# Patient Record
Sex: Male | Born: 1965 | Race: Black or African American | Hispanic: No | Marital: Single | Smoking: Never smoker
Health system: Southern US, Community
[De-identification: ages and names within clinical notes are randomized; demographics above are authoritative.]

## PROBLEM LIST (undated history)

## (undated) DIAGNOSIS — Z951 Presence of aortocoronary bypass graft: Secondary | ICD-10-CM

## (undated) DIAGNOSIS — E785 Hyperlipidemia, unspecified: Secondary | ICD-10-CM

## (undated) DIAGNOSIS — E669 Obesity, unspecified: Secondary | ICD-10-CM

## (undated) DIAGNOSIS — I10 Essential (primary) hypertension: Secondary | ICD-10-CM

## (undated) DIAGNOSIS — I2510 Atherosclerotic heart disease of native coronary artery without angina pectoris: Secondary | ICD-10-CM

## (undated) DIAGNOSIS — E119 Type 2 diabetes mellitus without complications: Secondary | ICD-10-CM

## (undated) HISTORY — DX: Obesity, unspecified: E66.9

## (undated) HISTORY — DX: Type 2 diabetes mellitus without complications: E11.9

## (undated) HISTORY — DX: Presence of aortocoronary bypass graft: Z95.1

## (undated) HISTORY — DX: Essential (primary) hypertension: I10

## (undated) HISTORY — DX: Hyperlipidemia, unspecified: E78.5

## (undated) HISTORY — PX: CORONARY ARTERY BYPASS GRAFT: SHX141

---

## 2002-07-16 ENCOUNTER — Emergency Department (HOSPITAL_COMMUNITY): Admission: EM | Admit: 2002-07-16 | Discharge: 2002-07-16 | Payer: Self-pay | Admitting: Emergency Medicine

## 2005-02-24 ENCOUNTER — Ambulatory Visit: Payer: Self-pay | Admitting: Internal Medicine

## 2014-05-28 ENCOUNTER — Ambulatory Visit (INDEPENDENT_AMBULATORY_CARE_PROVIDER_SITE_OTHER): Payer: Medicare Other | Admitting: Cardiovascular Disease

## 2014-05-28 ENCOUNTER — Encounter: Payer: Self-pay | Admitting: Cardiovascular Disease

## 2014-05-28 VITALS — BP 144/96 | HR 57 | Ht 69.0 in | Wt 260.5 lb

## 2014-05-28 DIAGNOSIS — I25700 Atherosclerosis of coronary artery bypass graft(s), unspecified, with unstable angina pectoris: Secondary | ICD-10-CM

## 2014-05-28 DIAGNOSIS — I10 Essential (primary) hypertension: Secondary | ICD-10-CM

## 2014-05-28 DIAGNOSIS — E785 Hyperlipidemia, unspecified: Secondary | ICD-10-CM | POA: Insufficient documentation

## 2014-05-28 DIAGNOSIS — I25810 Atherosclerosis of coronary artery bypass graft(s) without angina pectoris: Secondary | ICD-10-CM | POA: Insufficient documentation

## 2014-05-28 DIAGNOSIS — E119 Type 2 diabetes mellitus without complications: Secondary | ICD-10-CM | POA: Insufficient documentation

## 2014-05-28 NOTE — Assessment & Plan Note (Signed)
History of hypertension with blood pressure measured today 144/96 which she says is somewhat higher than usual. He is on amlodipine 5, metoprolol 50 mg daily. Continue current medications at current doses

## 2014-05-28 NOTE — Assessment & Plan Note (Signed)
On atorvastatin 40 mg daily. This is followed by his primary care physician. We will get his most recent lab work.

## 2014-05-28 NOTE — Progress Notes (Signed)
05/28/2014 James Rice   1965-10-29  [id number]  Primary Physician Paulino RilyJONES,ENRICO G, MD Primary Cardiologist: Runell GessJonathan J. Gunther Zawadzki MD Roseanne RenoFACP,FACC,FAHA, FSCAI   HPI:  Mr. James Rice is a very pleasant 48 year old severely overweight divorced African-American male with no children is currently retired and worked at a [employer] in the past. He was referred by Dr. Knox RoyaltyEnrico  Rice at [facility] to be established in her cardiovascular practice because of prior history of CAD. He has a history of treated hypertension, diabetes or hyperlipidemia. His does not smoke. He had coronary artery bypass [facility] [place]. He has been asymptomatic since.   Current Outpatient Prescriptions  Medication Sig Dispense Refill  . amLODipine (NORVASC) 5 MG tablet Take 1 tablet by mouth daily.  4  . atorvastatin (LIPITOR) 40 MG tablet Take 40 mg by mouth daily.    . metFORMIN (GLUCOPHAGE) 500 MG tablet Take 1 tablet by mouth 2 (two) times daily.    . methimazole (TAPAZOLE) 10 MG tablet Take 1 tablet by mouth 2 (two) times daily.    . metoprolol (LOPRESSOR) 50 MG tablet Take 1 tablet by mouth 2 (two) times daily.    . Multiple Vitamin (MULTIVITAMIN) tablet Take 1 tablet by mouth daily.    Marland Kitchen. omeprazole (PRILOSEC OTC) 20 MG tablet Take 20 mg by mouth daily.     No current facility-administered medications for this visit.    Allergies  Allergen Reactions  . Fish Allergy     History   Social History  . Marital Status: Single    Spouse Name: N/A    Number of Children: N/A  . Years of Education: N/A   Occupational History  . Not on file.   Social History Main Topics  . Smoking status: Never Smoker   . Smokeless tobacco: Never Used  . Alcohol Use: 1.2 - 1.8 oz/week    2-3 Shots of liquor per week  . Drug Use: No  . Sexual Activity: Not on file   Other Topics Concern  . Not on file   Social History Narrative  . No narrative on file     Review of  Systems: General: negative for chills, fever, night sweats or weight changes.  Cardiovascular: negative for chest pain, dyspnea on exertion, edema, orthopnea, palpitations, paroxysmal nocturnal dyspnea or shortness of breath Dermatological: negative for rash Respiratory: negative for cough or wheezing Urologic: negative for hematuria Abdominal: negative for nausea, vomiting, diarrhea, bright red blood per rectum, melena, or hematemesis Neurologic: negative for visual changes, syncope, or dizziness All other systems reviewed and are otherwise negative except as noted above.    Blood pressure 144/96, pulse 57, height 5\' 9"  (1.753 m), weight 260 lb 8 oz (118.162 kg).  General appearance: alert and no distress Neck: no adenopathy, no carotid bruit, no JVD, supple, symmetrical, trachea midline and thyroid not enlarged, symmetric, no tenderness/mass/nodules Lungs: clear to auscultation bilaterally Heart: regular rate and rhythm, S1, S2 normal, no murmur, click, rub or gallop Extremities: extremities normal, atraumatic, no cyanosis or edema  EKG sinus bradycardia 57 without ST or T-wave changes. I personally reviewed this EKG  ASSESSMENT AND PLAN:   CAD (coronary artery disease) of artery bypass graft History of CAD status [facility] [facility]. He has been asymptomatic since.  Hyperlipidemia On atorvastatin 40 mg daily. This is followed by his primary care physician. We will get his most recent lab  work.  Essential hypertension History of hypertension with blood pressure measured today 144/96 which she says is somewhat higher than usual. He is on amlodipine 5, metoprolol 50 mg daily. Continue current medications at current doses      Runell GessJonathan J. Zyriah Mask MD [facility] HealthFACP,FACC,FAHA, [facility] Center[facility]SCAI 05/28/2014 2:53 PM

## 2014-05-28 NOTE — Assessment & Plan Note (Addendum)
History of CAD status post coronary [facility] [place] 06/[facility]. He has been asymptomatic since.

## 2014-05-28 NOTE — Patient Instructions (Signed)
Your physician wants you to follow-up in: 1 year with Dr Berry. You will receive a reminder letter in the mail two months in advance. If you don't receive a letter, please call our office to schedule the follow-up appointment.  

## 2014-06-15 ENCOUNTER — Encounter (HOSPITAL_COMMUNITY): Payer: Self-pay | Admitting: Emergency Medicine

## 2014-06-15 ENCOUNTER — Emergency Department (HOSPITAL_COMMUNITY)
Admission: EM | Admit: 2014-06-15 | Discharge: 2014-06-16 | Disposition: A | Discharge status: Home / Self Care | Payer: Medicare Other | Attending: Emergency Medicine | Admitting: Emergency Medicine

## 2014-06-15 DIAGNOSIS — E119 Type 2 diabetes mellitus without complications: Secondary | ICD-10-CM | POA: Diagnosis not present

## 2014-06-15 DIAGNOSIS — I2510 Atherosclerotic heart disease of native coronary artery without angina pectoris: Secondary | ICD-10-CM | POA: Insufficient documentation

## 2014-06-15 DIAGNOSIS — E669 Obesity, unspecified: Secondary | ICD-10-CM | POA: Diagnosis not present

## 2014-06-15 DIAGNOSIS — E785 Hyperlipidemia, unspecified: Secondary | ICD-10-CM | POA: Insufficient documentation

## 2014-06-15 DIAGNOSIS — I10 Essential (primary) hypertension: Secondary | ICD-10-CM | POA: Diagnosis not present

## 2014-06-15 DIAGNOSIS — Z79899 Other long term (current) drug therapy: Secondary | ICD-10-CM | POA: Diagnosis not present

## 2014-06-15 DIAGNOSIS — Z7982 Long term (current) use of aspirin: Secondary | ICD-10-CM | POA: Diagnosis not present

## 2014-06-15 DIAGNOSIS — R1031 Right lower quadrant pain: Secondary | ICD-10-CM | POA: Diagnosis present

## 2014-06-15 DIAGNOSIS — Z955 Presence of coronary angioplasty implant and graft: Secondary | ICD-10-CM | POA: Diagnosis not present

## 2014-06-15 HISTORY — DX: Atherosclerotic heart disease of native coronary artery without angina pectoris: I25.10

## 2014-06-15 LAB — CBC WITH DIFFERENTIAL/PLATELET
Basophils Absolute: 0 10*3/uL (ref 0.0–0.1)
Basophils Relative: 0 % (ref 0–1)
Eosinophils Absolute: 0.1 10*3/uL (ref 0.0–0.7)
Eosinophils Relative: 1 % (ref 0–5)
HEMATOCRIT: 42.1 % (ref 39.0–52.0)
HEMOGLOBIN: 14.6 g/dL (ref 13.0–17.0)
LYMPHS ABS: 2.7 10*3/uL (ref 0.7–4.0)
LYMPHS PCT: 30 % (ref 12–46)
MCH: 27.2 pg (ref 26.0–34.0)
MCHC: 34.7 g/dL (ref 30.0–36.0)
MCV: 78.4 fL (ref 78.0–100.0)
MONO ABS: 0.7 10*3/uL (ref 0.1–1.0)
Monocytes Relative: 7 % (ref 3–12)
Neutro Abs: 5.6 10*3/uL (ref 1.7–7.7)
Neutrophils Relative %: 62 % (ref 43–77)
Platelets: 235 10*3/uL (ref 150–400)
RBC: 5.37 MIL/uL (ref 4.22–5.81)
RDW: 14.9 % (ref 11.5–15.5)
WBC: 9.1 10*3/uL (ref 4.0–10.5)

## 2014-06-15 LAB — URINALYSIS, ROUTINE W REFLEX MICROSCOPIC
Bilirubin Urine: NEGATIVE
Glucose, UA: NEGATIVE mg/dL
Hgb urine dipstick: NEGATIVE
Ketones, ur: NEGATIVE mg/dL
LEUKOCYTES UA: NEGATIVE
Nitrite: NEGATIVE
PROTEIN: NEGATIVE mg/dL
Specific Gravity, Urine: 1.016 (ref 1.005–1.030)
Urobilinogen, UA: 1 mg/dL (ref 0.0–1.0)
pH: 5.5 (ref 5.0–8.0)

## 2014-06-15 LAB — LIPASE, BLOOD: Lipase: 28 U/L (ref 11–59)

## 2014-06-15 LAB — COMPREHENSIVE METABOLIC PANEL
ALT: 10 U/L (ref 0–53)
AST: 18 U/L (ref 0–37)
Albumin: 3.9 g/dL (ref 3.5–5.2)
Alkaline Phosphatase: 120 U/L — ABNORMAL HIGH (ref 39–117)
Anion gap: 12 (ref 5–15)
BUN: 10 mg/dL (ref 6–23)
CALCIUM: 9.5 mg/dL (ref 8.4–10.5)
CO2: 29 mEq/L (ref 19–32)
CREATININE: 0.85 mg/dL (ref 0.50–1.35)
Chloride: 94 mEq/L — ABNORMAL LOW (ref 96–112)
GFR calc Af Amer: >90 mL/min (ref >90)
GFR calc non Af Amer: >90 mL/min (ref >90)
GLUCOSE: 109 mg/dL — AB (ref 70–99)
Potassium: 3.3 mEq/L — ABNORMAL LOW (ref 3.7–5.3)
SODIUM: 135 meq/L — AB (ref 137–147)
Total Bilirubin: 0.6 mg/dL (ref 0.3–1.2)
Total Protein: 8.3 g/dL (ref 6.0–8.3)

## 2014-06-15 NOTE — ED Provider Notes (Signed)
CSN: [id number]     Arrival date & time 06/15/14  2128 History   First MD Initiated Contact with Patient 06/15/14 2212     Chief Complaint  Patient presents with  . Abdominal Pain     (Consider location/radiation/quality/duration/timing/severity/associated sxs/prior Treatment) HPI   James Rice is a 48 y.o. male presents for evaluation of intermittent right lower quadrant pain, for 2 days.  He had a nuclear medicine stress test, 4 days ago, as follow-up for his coronary bypass, 2 years ago.  He denies fever, chills, nausea, vomiting, diarrhea, constipation, dysuria, urinary frequency, testicular pain or penile discharge.  He's never had this before.  He is taking his usual medications.  There are no other known modifying factors.   Past Medical History  Diagnosis Date  . Status post coronary artery bypass grafting     [place]  . Hypertension   . Diabetes   . Hyperlipidemia   . Obesity   . Coronary artery disease    Past Surgical History  Procedure Laterality Date  . Coronary artery bypass graft     No family history on file. History  Substance Use Topics  . Smoking status: Never Smoker   . Smokeless tobacco: Never Used  . Alcohol Use: 1.2 - 1.8 oz/week    2-3 Shots of liquor per week    Review of Systems  All other systems reviewed and are negative.     Allergies  Fish allergy  Home Medications   Prior to Admission medications   Medication Sig Start Date End Date Taking? Authorizing Provider  amLODipine (NORVASC) 5 MG tablet Take 5 mg by mouth daily.  04/16/14  Yes Historical Provider, MD  aspirin 81 MG tablet Take 81 mg by mouth daily.   Yes Historical Provider, MD  atorvastatin (LIPITOR) 40 MG tablet Take 20 mg by mouth at bedtime.    Yes Historical Provider, MD  losartan-hydrochlorothiazide (HYZAAR) 50-12.5 MG per tablet Take 1 tablet by mouth daily. 06/10/14  Yes Historical Provider, MD  metFORMIN (GLUCOPHAGE) 500 MG tablet Take 500 mg by  mouth 2 (two) times daily.  04/15/14  Yes Historical Provider, MD  metoprolol (LOPRESSOR) 50 MG tablet Take 50 mg by mouth 2 (two) times daily.  04/15/14  Yes Historical Provider, MD  Multiple Vitamin (MULTIVITAMIN) tablet Take 1 tablet by mouth daily.   Yes Historical Provider, MD  omeprazole (PRILOSEC OTC) 20 MG tablet Take 20 mg by mouth daily.   Yes Historical Provider, MD  potassium chloride (K-DUR) 10 MEQ tablet Take 10 mEq by mouth every other day. 06/10/14  Yes Historical Provider, MD   BP 142/72 mmHg  Pulse 63  Temp(Src) 97.9 F (36.6 C) (Oral)  Resp 14  Ht 5\' 9"  (1.753 m)  Wt 257 lb (116.574 kg)  BMI 37.93 kg/m2  SpO2 99% Physical Exam  Constitutional: He is oriented to person, place, and time. He appears well-developed and well-nourished.  HENT:  Head: Normocephalic and atraumatic.  Right Ear: External ear normal.  Left Ear: External ear normal.  Eyes: Conjunctivae and EOM are normal. Pupils are equal, round, and reactive to light.  Neck: Normal range of motion and phonation normal. Neck supple.  Cardiovascular: Normal rate, regular rhythm and normal heart sounds.   Pulmonary/Chest: Effort normal and breath sounds normal. He exhibits no bony tenderness.  Abdominal: Soft. He exhibits no mass. There is tenderness (right lower quadrant, mild). There is no rebound and no guarding.  Musculoskeletal: Normal range of motion.  Neurological: He is alert and oriented to person, place, and time. No cranial nerve deficit or sensory deficit. He exhibits normal muscle tone. Coordination normal.  Skin: Skin is warm, dry and intact.  Psychiatric: He has a normal mood and affect. His behavior is normal. Judgment and thought content normal.  Nursing note and vitals reviewed.   ED Course  Procedures (including critical care time) Medications - No data to display  Patient Vitals for the past 24 hrs:  BP Temp Temp src Pulse Resp SpO2 Height Weight  06/15/14 2135 142/72 mmHg 97.9 F (36.6  C) Oral 63 14 99 % 5\' 9"  (1.753 m) 257 lb (116.574 kg)    23:00- CT scan ordered to evaluate for appendicitis.    Labs Review Labs Reviewed  COMPREHENSIVE METABOLIC PANEL - Abnormal; Notable for the following:    Sodium 135 (*)    Potassium 3.3 (*)    Chloride 94 (*)    Glucose, Bld 109 (*)    Alkaline Phosphatase 120 (*)    All other components within normal limits  CBC WITH DIFFERENTIAL  LIPASE, BLOOD  URINALYSIS, ROUTINE W REFLEX MICROSCOPIC    Imaging Review No results found.   EKG Interpretation None      MDM   Final diagnoses:  Right lower quadrant pain    Nonspecific abdominal pain, non-toxic clinically. CT ordered.  Nursing Notes Reviewed/ Care Coordinated Applicable Imaging Reviewed Interpretation of Laboratory Data incorporated into ED treatment  Care to Dr. Judd Lienelo, to disposition after CT abdomen    Flint MelterElliott L Saja Bartolini, MD 06/16/14 1442

## 2014-06-15 NOTE — ED Notes (Signed)
Pt. reports intermittent RLQ pain onset Wednesday last week , denies nausea, vomitting or diarrhea . No fever or chills. Denies pain at triage .

## 2014-06-15 NOTE — ED Notes (Signed)
Pt states that [facility] and was told that the nuclear medicine may upset his stomach. States that the pain started in the middle of the abd and moved to the right.

## 2014-06-16 ENCOUNTER — Emergency Department (HOSPITAL_COMMUNITY): Payer: Medicare Other

## 2014-06-16 MED ORDER — IOHEXOL 300 MG/ML  SOLN
80.0000 mL | Freq: Once | INTRAMUSCULAR | Status: AC | PRN
  Administered 2014-06-16: 80 mL via INTRAVENOUS

## 2014-06-16 MED ORDER — HYDROCODONE-ACETAMINOPHEN 5-325 MG PO TABS: 1.0000 | ORAL_TABLET | Freq: Four times a day (QID) | ORAL | Status: DC | PRN

## 2014-06-16 NOTE — ED Notes (Signed)
Pt finished contrast ct made aware.

## 2014-06-16 NOTE — Discharge Instructions (Signed)
Hydrocodone as prescribed as needed for pain.  Return to the emergency department if he develops severe pain, bloody stool, high fever, or other new and concerning symptoms.   Abdominal Pain Many things can cause abdominal pain. Usually, abdominal pain is not caused by a disease and will improve without treatment. It can often be observed and treated at home. Your health care provider will do a physical exam and possibly order blood tests and X-rays to help determine the seriousness of your pain. However, in many cases, more time must pass before a clear cause of the pain can be found. Before that point, your health care provider may not know if you need more testing or further treatment. HOME CARE INSTRUCTIONS  Monitor your abdominal pain for any changes. The following actions may help to alleviate any discomfort you are experiencing:  Only take over-the-counter or prescription medicines as directed by your health care provider.  Do not take laxatives unless directed to do so by your health care provider.  Try a clear liquid diet (broth, tea, or water) as directed by your health care provider. Slowly move to a bland diet as tolerated. SEEK MEDICAL CARE IF:  You have unexplained abdominal pain.  You have abdominal pain associated with nausea or diarrhea.  You have pain when you urinate or have a bowel movement.  You experience abdominal pain that wakes you in the night.  You have abdominal pain that is worsened or improved by eating food.  You have abdominal pain that is worsened with eating fatty foods.  You have a fever. SEEK IMMEDIATE MEDICAL CARE IF:   Your pain does not go away within 2 hours.  You keep throwing up (vomiting).  Your pain is felt only in portions of the abdomen, such as the right side or the left lower portion of the abdomen.  You pass bloody or black tarry stools. MAKE SURE YOU:  Understand these instructions.   Will watch your condition.   Will  get help right away if you are not doing well or get worse.  Document Released: 03/30/2005 Document Revised: 06/25/2013 Document Reviewed: 02/27/2013 [facility] Patient Information 2015 [employer] [place]. This information is not intended to replace advice given to you by your health care provider. Make sure you discuss any questions you have with your health care provider.

## 2014-06-16 NOTE — ED Provider Notes (Signed)
Care assumed from Dr. Effie ShyWentz at shift change. Patient presents with right lower quadrant pain for the past 2 days. A CT scan was ordered to rule out appendicitis. This returned and was negative. He appears more comfortable and I believe appropriate for discharge. He has no fever, no white count, and a CT scan that is otherwise unremarkable. He will be discharged with pain medication and when necessary return.  James Lyonsouglas Derril Franek, MD 06/16/14 [phone]

## 2015-01-14 ENCOUNTER — Telehealth: Payer: Self-pay | Admitting: Family Medicine

## 2015-01-14 NOTE — Telephone Encounter (Signed)
Pt states he saw dr Kirtland Bouchardk many years ago and wants to know if he will acceptnhim back as a pt.

## 2015-01-15 NOTE — Telephone Encounter (Signed)
Kathy, no Dr.K is not accepting new patients.

## 2015-02-25 NOTE — Telephone Encounter (Signed)
Pt lm for pt.

## 2015-06-30 ENCOUNTER — Telehealth: Payer: Self-pay | Admitting: Cardiovascular Disease

## 2015-06-30 NOTE — Telephone Encounter (Signed)
2 weeks ago went to game dressed warm Lifting weights last week [place] [facility] [place], labs, everything checked out ok.  Placed on naproxen. Concerned that they didn't take a chest x ray.  Is coughing loose non productive and thinks he could benefit from antibiotic. Do you want him to have a chest x ray as he request? Do you want him to be on an antibiotic as he request?

## 2015-06-30 NOTE — Telephone Encounter (Signed)
James Rice is calling because he is having some chest discomfort , does not feel bad or anything just the chest discomfort .Marland Kitchen.  Please call   Thanks

## 2015-06-30 NOTE — Telephone Encounter (Signed)
Needs to be addressed with his primary care physician

## 2015-06-30 NOTE — Telephone Encounter (Signed)
Sent message to Dr. Yetta BarreJones and advised patient to cal Dr. Yetta BarreJones for patients request to do an xray , chest and antibiotic

## 2016-03-22 ENCOUNTER — Encounter: Payer: Self-pay | Admitting: Cardiovascular Disease

## 2016-03-22 ENCOUNTER — Ambulatory Visit (INDEPENDENT_AMBULATORY_CARE_PROVIDER_SITE_OTHER): Payer: Medicare Other | Admitting: Cardiovascular Disease

## 2016-03-22 VITALS — BP 132/78 | HR 58 | Ht 69.0 in | Wt 271.4 lb

## 2016-03-22 DIAGNOSIS — E785 Hyperlipidemia, unspecified: Secondary | ICD-10-CM

## 2016-03-22 DIAGNOSIS — I10 Essential (primary) hypertension: Secondary | ICD-10-CM

## 2016-03-22 NOTE — Progress Notes (Signed)
03/22/2016 Angelene GiovanniMichael A Clauss   1966/05/18  [id number]  Primary Physician Paulino RilyJONES,ENRICO G, MD Primary Cardiologist: Runell GessJonathan J Eureka Valdes MD Roseanne RenoFACP, FACC, FAHA, FSCAI  HPI:  Mr. James CellaBoyd is a very pleasant 50 year old severely overweight divorced African-American male with no children is currently retired and worked at a [employer] in the past. He was referred by Dr. Knox RoyaltyEnrico  Jones at [facility] to be established in her cardiovascular practice because of prior history of CAD. I last saw him in the office 05/28/14. He has a history of treated hypertension, diabetes or hyperlipidemia. His does not smoke. He had coronary artery [facility] . He has been asymptomatic since.   Current Outpatient Prescriptions  Medication Sig Dispense Refill  . amLODipine (NORVASC) 5 MG tablet Take 5 mg by mouth daily.   4  . aspirin 81 MG tablet Take 81 mg by mouth daily.    Marland Kitchen. atorvastatin (LIPITOR) 40 MG tablet Take 20 mg by mouth at bedtime.     Marland Kitchen. HYDROcodone-acetaminophen (NORCO) 5-325 MG per tablet Take 1-2 tablets by mouth every 6 (six) hours as needed. 10 tablet 0  . losartan-hydrochlorothiazide (HYZAAR) 50-12.5 MG per tablet Take 1 tablet by mouth daily.  3  . metFORMIN (GLUCOPHAGE) 500 MG tablet Take 500 mg by mouth 2 (two) times daily.     . metoprolol (LOPRESSOR) 50 MG tablet Take 50 mg by mouth 2 (two) times daily.     . Multiple Vitamin (MULTIVITAMIN) tablet Take 1 tablet by mouth daily.    Marland Kitchen. omeprazole (PRILOSEC OTC) 20 MG tablet Take 20 mg by mouth daily.    . potassium chloride (K-DUR) 10 MEQ tablet Take 10 mEq by mouth every other day.  3   No current facility-administered medications for this visit.     Allergies  Allergen Reactions  . Fish Allergy Nausea And Vomiting    Social History   Social History  . Marital status: Single    Spouse name: N/A  . Number of children: N/A  . Years of education: N/A   Occupational History  . Not on  file.   Social History Main Topics  . Smoking status: Never Smoker  . Smokeless tobacco: Never Used  . Alcohol use 1.2 - 1.8 oz/week    2 - 3 Shots of liquor per week  . Drug use: No  . Sexual activity: Not on file   Other Topics Concern  . Not on file   Social History Narrative  . No narrative on file     Review of Systems: General: negative for chills, fever, night sweats or weight changes.  Cardiovascular: negative for chest pain, dyspnea on exertion, edema, orthopnea, palpitations, paroxysmal nocturnal dyspnea or shortness of breath Dermatological: negative for rash Respiratory: negative for cough or wheezing Urologic: negative for hematuria Abdominal: negative for nausea, vomiting, diarrhea, bright red blood per rectum, melena, or hematemesis Neurologic: negative for visual changes, syncope, or dizziness All other systems reviewed and are otherwise negative except as noted above.    Blood pressure 132/78, pulse (!) 58, height 5\' 9"  (1.753 m), weight 271 lb 6.4 oz (123.1 kg).  General appearance: alert and no distress Neck: no adenopathy, no carotid bruit, no JVD, supple, symmetrical, trachea midline and thyroid not enlarged, symmetric, no tenderness/mass/nodules Lungs: clear to auscultation bilaterally Heart: regular rate and rhythm, S1, S2 normal, no murmur, click, rub or gallop Extremities: extremities normal, atraumatic, no cyanosis or edema  EKG sinus bradycardia 58 without ST or T-wave changes. I personally reviewed this EKG  ASSESSMENT AND PLAN:   CAD (coronary artery disease) of artery bypass graft History of CAD status post coronary artery bypass grafting x 1 in [facility] 06/22/12. He has been a symptomatic since.  Essential hypertension History of hypertension blood pressure measured 132/78. He is on amlodipine, losartan, metoprolol and hydrochlorothiazide. Continue current meds at current dosing  Hyperlipidemia History of hyperlipidemia on  statin therapy followed by his PCP      Runell Gess MD [facility], [facility] 03/22/2016 3:40 PM

## 2016-03-22 NOTE — Assessment & Plan Note (Signed)
History of CAD status [place] [facility] [place] [facility] [place] [facility] [place]. He has been a symptomatic since.

## 2016-03-22 NOTE — Patient Instructions (Signed)
Medication Instructions:  NO CHANGES.  Labwork: Labwork will be requested from your primary care physician.   Follow-Up: Your physician wants you to follow-up in: 12 MONTHS WITH DR BERRY.  You will receive a reminder letter in the mail two months in advance. If you don't receive a letter, please call our office to schedule the follow-up appointment.   If you need a refill on your cardiac medications before your next appointment, please call your pharmacy.   

## 2016-03-22 NOTE — Assessment & Plan Note (Signed)
History of hypertension blood pressure measured 132/78. He is on amlodipine, losartan, metoprolol and hydrochlorothiazide. Continue current meds at current dosing

## 2016-03-22 NOTE — Assessment & Plan Note (Signed)
History of hyperlipidemia on statin therapy followed by his PCP 

## 2016-06-29 ENCOUNTER — Telehealth: Payer: Self-pay | Admitting: Cardiovascular Disease

## 2016-06-29 MED ORDER — AMLODIPINE BESYLATE 5 MG PO TABS: 5.0000 mg | ORAL_TABLET | Freq: Every day | ORAL | 11 refills | Status: DC

## 2016-06-29 NOTE — Telephone Encounter (Signed)
Pt stated he is having chest pressure but no chest pain and would like to speak to nurse.   Pt c/o of Chest Pain: 1. Are you having CP right now? Yes 2. Are you experiencing any other symptoms (ex. SOB, nausea, vomiting, sweating)? No 3. How long have you been experiencing CP? 2 to 3 days 4. Is your CP continuous or coming and going? Continuous  5. Have you taken Nitroglycerin? Pt don't take

## 2016-06-29 NOTE — Telephone Encounter (Signed)
Spoke with pt states that he has been feeling chest pressure since last week after lifting weights. Pt denies any other symptoms, chest pain, shortness of breath,  no lightheaded or dizziness. Pt states that he is taking all of his medications as ordered except the metoprolol 50mg  1/2 tablet twice daily and also he states that he is not taking the amlodipine 5mg  but he did not realize that he was not taking this until right now. I will send a refill. Pt states that he does not take BP at home. Please advise

## 2016-06-29 NOTE — Telephone Encounter (Signed)
Spoke with DOD-Reed she states that it sounds like muscle pain and pt should take ibuprofen to alleviate pain if needed and if pain persists pt can make an appt with APP next week.  Pt informed of DOD message and to go to the ER if symptoms persist, worsen, or any new symptoms develop verbalizes understanding.

## 2017-04-21 ENCOUNTER — Encounter (INDEPENDENT_AMBULATORY_CARE_PROVIDER_SITE_OTHER): Payer: Self-pay

## 2017-04-21 ENCOUNTER — Encounter: Payer: Self-pay | Admitting: Cardiovascular Disease

## 2017-04-21 ENCOUNTER — Ambulatory Visit (INDEPENDENT_AMBULATORY_CARE_PROVIDER_SITE_OTHER): Payer: Medicare Other | Admitting: Cardiovascular Disease

## 2017-04-21 VITALS — BP 128/88 | HR 55 | Ht 69.0 in | Wt 276.0 lb

## 2017-04-21 DIAGNOSIS — I200 Unstable angina: Secondary | ICD-10-CM | POA: Diagnosis not present

## 2017-04-21 DIAGNOSIS — E785 Hyperlipidemia, unspecified: Secondary | ICD-10-CM

## 2017-04-21 DIAGNOSIS — I25700 Atherosclerosis of coronary artery bypass graft(s), unspecified, with unstable angina pectoris: Secondary | ICD-10-CM

## 2017-04-21 DIAGNOSIS — I10 Essential (primary) hypertension: Secondary | ICD-10-CM

## 2017-04-21 NOTE — Assessment & Plan Note (Addendum)
History of coronary disease status post bypass grafting 1  [facility] [place]. He does have atypical chest wall pain does not sound ischemic.

## 2017-04-21 NOTE — Patient Instructions (Signed)

## 2017-04-21 NOTE — Assessment & Plan Note (Signed)
History of essential hypertension blood pressure measured at 128/88. He is on amlodipine and metoprolol as well as losartan and Hydrocort thiazide. Continue current meds at current dosing.

## 2017-04-21 NOTE — Progress Notes (Signed)
04/21/2017 James GiovanniMichael A Jacobsen   27-Jul-1965  [id number]  Primary Physician Knox RoyaltyJones, Enrico, MD Primary Cardiologist: Runell GessJonathan J Berry MD Nicholes CalamityFACP, FACC, FAHA, [place]  HPI:  James Rice is a 51 y.o. male  severely overweight divorced African-American male with no children is currently retired and worked at a [employer] in the past. He was referred by Dr. Knox RoyaltyEnrico Jones at [facility] to be established in her cardiovascular practice because of prior history of CAD. I last saw him in the office 03/12/16. He has a history of treated hypertension, diabetes or hyperlipidemia. His does not smoke. He [facility] [place]. He has been asymptomatic since. He does get occasional atypical musculoskeletal chest wall pain.   Current Meds  Medication Sig  . amLODipine (NORVASC) 5 MG tablet Take 1 tablet (5 mg total) by mouth daily.  Marland Kitchen. aspirin 81 MG tablet Take 81 mg by mouth daily.  Marland Kitchen. atorvastatin (LIPITOR) 40 MG tablet Take 20 mg by mouth at bedtime.   Marland Kitchen. HYDROcodone-acetaminophen (NORCO) 5-325 MG per tablet Take 1-2 tablets by mouth every 6 (six) hours as needed.  Marland Kitchen. losartan-hydrochlorothiazide (HYZAAR) 50-12.5 MG per tablet Take 1 tablet by mouth daily.  . metFORMIN (GLUCOPHAGE) 500 MG tablet Take 500 mg by mouth 2 (two) times daily.   . metoprolol (LOPRESSOR) 50 MG tablet Take 50 mg by mouth 2 (two) times daily.   . Multiple Vitamin (MULTIVITAMIN) tablet Take 1 tablet by mouth daily.  Marland Kitchen. omeprazole (PRILOSEC OTC) 20 MG tablet Take 20 mg by mouth daily.  . potassium chloride (K-DUR) 10 MEQ tablet Take 10 mEq by mouth every other day.     Allergies  Allergen Reactions  . Fish Allergy Nausea And Vomiting    Social History   Social History  . Marital status: Single    Spouse name: N/A  . Number of children: N/A  . Years of education: N/A   Occupational History  . Not on file.   Social History Main Topics  . Smoking status:  Never Smoker  . Smokeless tobacco: Never Used  . Alcohol use 1.2 - 1.8 oz/week    2 - 3 Shots of liquor per week  . Drug use: No  . Sexual activity: Not on file   Other Topics Concern  . Not on file   Social History Narrative  . No narrative on file     Review of Systems: General: negative for chills, fever, night sweats or weight changes.  Cardiovascular: negative for chest pain, dyspnea on exertion, edema, orthopnea, palpitations, paroxysmal nocturnal dyspnea or shortness of breath Dermatological: negative for rash Respiratory: negative for cough or wheezing Urologic: negative for hematuria Abdominal: negative for nausea, vomiting, diarrhea, bright red blood per rectum, melena, or hematemesis Neurologic: negative for visual changes, syncope, or dizziness All other systems reviewed and are otherwise negative except as noted above.    Blood pressure 128/88, pulse (!) 55, height 5\' 9"  (1.753 m), weight 276 lb (125.2 kg).  General appearance: alert and no distress Neck: no adenopathy, no carotid bruit, no JVD, supple, symmetrical, trachea midline and thyroid not enlarged, symmetric, no tenderness/mass/nodules Lungs: clear to auscultation bilaterally Heart: regular rate and rhythm, S1, S2 normal, no murmur, click, rub or gallop Extremities: extremities normal, atraumatic, no cyanosis or edema Pulses: 2+ and symmetric Skin: Skin color, texture, turgor normal. No rashes or lesions Neurologic: Alert and oriented X 3, normal strength and tone.  Normal symmetric reflexes. Normal coordination and gait  EKG sinus bradycardia 55 without ST or T-wave changes. I personally reviewed this EKG.  ASSESSMENT AND PLAN:   CAD (coronary artery disease) of artery bypass graft History of coronary disease status post bypass grafting 1  06/22/12 in [facility]. He does have atypical chest wall pain does not sound ischemic.  Essential hypertension History of essential hypertension  blood pressure measured at 128/88. He is on amlodipine and metoprolol as well as losartan and Hydrocort thiazide. Continue current meds at current dosing.  Hyperlipidemia History of hyperlipidemia on statin therapy. We'll recheck a lipid and liver profile.      Runell Gess MD FACP,FACC,FAHA, [facility] 04/21/2017 9:56 AM

## 2017-04-21 NOTE — Assessment & Plan Note (Signed)
History of hyperlipidemia on statin therapy. We'll recheck a lipid and liver profile 

## 2018-04-12 ENCOUNTER — Emergency Department (HOSPITAL_COMMUNITY): Payer: Medicare Other

## 2018-04-12 ENCOUNTER — Other Ambulatory Visit: Payer: Self-pay | Admitting: Physician Assistant

## 2018-04-12 ENCOUNTER — Encounter (HOSPITAL_COMMUNITY): Payer: Self-pay

## 2018-04-12 ENCOUNTER — Emergency Department (HOSPITAL_COMMUNITY)
Admission: EM | Admit: 2018-04-12 | Discharge: 2018-04-12 | Disposition: A | Discharge status: Home / Self Care | Payer: Medicare Other | Attending: Emergency Medicine | Admitting: Emergency Medicine

## 2018-04-12 DIAGNOSIS — I259 Chronic ischemic heart disease, unspecified: Secondary | ICD-10-CM | POA: Insufficient documentation

## 2018-04-12 DIAGNOSIS — I10 Essential (primary) hypertension: Secondary | ICD-10-CM

## 2018-04-12 DIAGNOSIS — Z79899 Other long term (current) drug therapy: Secondary | ICD-10-CM | POA: Diagnosis not present

## 2018-04-12 DIAGNOSIS — Z7984 Long term (current) use of oral hypoglycemic drugs: Secondary | ICD-10-CM | POA: Diagnosis not present

## 2018-04-12 DIAGNOSIS — R079 Chest pain, unspecified: Secondary | ICD-10-CM

## 2018-04-12 DIAGNOSIS — R0789 Other chest pain: Secondary | ICD-10-CM | POA: Insufficient documentation

## 2018-04-12 DIAGNOSIS — Z951 Presence of aortocoronary bypass graft: Secondary | ICD-10-CM | POA: Insufficient documentation

## 2018-04-12 DIAGNOSIS — E119 Type 2 diabetes mellitus without complications: Secondary | ICD-10-CM | POA: Insufficient documentation

## 2018-04-12 DIAGNOSIS — Z7982 Long term (current) use of aspirin: Secondary | ICD-10-CM | POA: Insufficient documentation

## 2018-04-12 DIAGNOSIS — I2510 Atherosclerotic heart disease of native coronary artery without angina pectoris: Secondary | ICD-10-CM

## 2018-04-12 LAB — BASIC METABOLIC PANEL
ANION GAP: 11 (ref 5–15)
BUN: 15 mg/dL (ref 6–20)
CHLORIDE: 102 mmol/L (ref 98–111)
CO2: 24 mmol/L (ref 22–32)
Calcium: 9.7 mg/dL (ref 8.9–10.3)
Creatinine, Ser: 0.7 mg/dL (ref 0.61–1.24)
GFR calc non Af Amer: >60 mL/min (ref >60)
Glucose, Bld: 154 mg/dL — ABNORMAL HIGH (ref 70–99)
Potassium: 4.3 mmol/L (ref 3.5–5.1)
Sodium: 137 mmol/L (ref 135–145)

## 2018-04-12 LAB — CBC
HCT: 40.2 % (ref 39.0–52.0)
HEMOGLOBIN: 12.7 g/dL — AB (ref 13.0–17.0)
MCH: 26.2 pg (ref 26.0–34.0)
MCHC: 31.6 g/dL (ref 30.0–36.0)
MCV: 82.9 fL (ref 80.0–100.0)
NRBC: 0 % (ref 0.0–0.2)
PLATELETS: 257 10*3/uL (ref 150–400)
RBC: 4.85 MIL/uL (ref 4.22–5.81)
RDW: 14.1 % (ref 11.5–15.5)
WBC: 7.1 10*3/uL (ref 4.0–10.5)

## 2018-04-12 LAB — HEPATIC FUNCTION PANEL
ALT: 68 U/L — AB (ref 0–44)
AST: 49 U/L — AB (ref 15–41)
Albumin: 3.2 g/dL — ABNORMAL LOW (ref 3.5–5.0)
Alkaline Phosphatase: 127 U/L — ABNORMAL HIGH (ref 38–126)
BILIRUBIN DIRECT: 0.2 mg/dL (ref 0.0–0.2)
Indirect Bilirubin: 0.7 mg/dL (ref 0.3–0.9)
TOTAL PROTEIN: 7.1 g/dL (ref 6.5–8.1)
Total Bilirubin: 0.9 mg/dL (ref 0.3–1.2)

## 2018-04-12 LAB — I-STAT TROPONIN, ED: Troponin i, poc: 0 ng/mL (ref 0.00–0.08)

## 2018-04-12 LAB — LIPASE, BLOOD: Lipase: 44 U/L (ref 11–51)

## 2018-04-12 MED ORDER — GI COCKTAIL ~~LOC~~
30.0000 mL | Freq: Once | ORAL | Status: AC
  Administered 2018-04-12: 30 mL via ORAL
  Filled 2018-04-12: qty 30

## 2018-04-12 NOTE — Consult Note (Addendum)
Cardiology Consult    Patient ID: ZEN CEDILLOS MRN: [id number], DOB/AGE: 1965-09-07   Admit date: 04/12/2018 Date of Consult: 04/12/2018  Primary Physician: Knox Royalty, MD Primary Cardiologist: Nanetta Batty, MD Requesting Provider: Dr. Donnald Garre  Patient Profile    James Rice is a 52 y.o. male with a history of CAD s/p CABG x1 in 06/2012 in [place], [place], hypertension, hyperlipidemia, type 2 diabetes mellitus, obesity, and sleep apnea on CPAP, who is being seen today for the evaluation of chest pain at the request of Dr. Donnald Garre.  History of Present Illness    James Rice is a 52 y.o. male with a history of CAD s/p CABG x1 in 06/2012 in [place], [place], hypertension, hyperlipidemia, type 2 diabetes mellitus, obesity, and sleep apnea on CPAP, who is followed by Dr. Allyson Sabal. He last saw Dr. Allyson Sabal at his annual follow-up on 04/21/2017 at which time he reported continued atypical musculoskeletal chest wall pain but was otherwise doing well. No medication changes were made at that visit.   Patient presents to the ED today with intermittent exertional chest pain for the past week. Patient reports he got "really drunk" 2 weeks ago. The next day he vomited several times and thinks he may have irritated his esophagus. Patient first noticed his chest pain about 1 week ago while he was arguing with his girlfriend. He has noticed it off an on this past week when he is walking. He describes the pain as a "burning sensation" and states it "feels like something has gone down the wrong way." He ranks the pain as a 7/10 on the pain scale and states it last about 5 minutes and resolves with rest. Patient took Nitroglycerin during one of these episodes with resolution of the pain. Patient also notes improvement with Omeprazole. During one episode of chest pain, he also noticed pain in his back but denies any radiation to jaw or arms. His last episode of chest pain occurred yesterday while going up and down  the stairs unloading his car. He denies any associated shortness of breath, nausea/vomiting, diaphoresis, palpitations, lightheadedness, or dizziness. Patient also denies any recent orthopnea, PND, or lower extremity edema. He states this pain feels different than the chest pain he had prior to his CABG in 2013.  Upon arrival to the ED, vital signs stable. EKG showed sinus tachycardia (rate 103 bpm) with no acute ST changes. I-stat troponin was negative. Chest x-ray showed no acute processes. WBC 7.1, Hgb 12.7, Plts 257. Na 137, K 4.3, Glucose 154, SCr 0.70.   Patient denies any active chest pain at this time (other than the very mild chest pain pain that he chronically has). He also denies any shortness of breath.   Past Medical History   Past Medical History:  Diagnosis Date  . Coronary artery disease   . Diabetes (HCC)   . Hyperlipidemia   . Hypertension   . Obesity   . Status post coronary artery bypass grafting    06/22/12 in [place]    Past Surgical History:  Procedure Laterality Date  . CORONARY ARTERY BYPASS GRAFT       Allergies  Allergies  Allergen Reactions  . Fish Allergy Nausea And Vomiting    Inpatient Medications      Family History    No family history on file. has no family status information on file.    Social History    Social History   Socioeconomic History  . Marital status: Single  Spouse name: Not on file  . Number of children: Not on file  . Years of education: Not on file  . Highest education level: Not on file  Occupational History  . Not on file  Social Needs  . Financial resource strain: Not on file  . Food insecurity:    Worry: Not on file    Inability: Not on file  . Transportation needs:    Medical: Not on file    Non-medical: Not on file  Tobacco Use  . Smoking status: Never Smoker  . Smokeless tobacco: Never Used  Substance and Sexual Activity  . Alcohol use: Yes    Alcohol/week: 2.0 - 3.0 standard drinks     Types: 2 - 3 Shots of liquor per week  . Drug use: No  . Sexual activity: Not on file  Lifestyle  . Physical activity:    Days per week: Not on file    Minutes per session: Not on file  . Stress: Not on file  Relationships  . Social connections:    Talks on phone: Not on file    Gets together: Not on file    Attends religious service: Not on file    Active member of club or organization: Not on file    Attends meetings of clubs or organizations: Not on file    Relationship status: Not on file  . Intimate partner violence:    Fear of current or ex partner: Not on file    Emotionally abused: Not on file    Physically abused: Not on file    Forced sexual activity: Not on file  Other Topics Concern  . Not on file  Social History Narrative  . Not on file     Review of Systems    Review of Systems  Constitutional: Positive for weight loss. Negative for chills, diaphoresis, fever and malaise/fatigue.  HENT: Negative for congestion.   Eyes: Positive for blurred vision. Negative for double vision.  Respiratory: Positive for cough. Negative for hemoptysis, sputum production and shortness of breath.   Cardiovascular: Positive for chest pain. Negative for palpitations, orthopnea, leg swelling and PND.  Gastrointestinal: Positive for heartburn. Negative for abdominal pain, blood in stool, constipation, nausea and vomiting.  Genitourinary: Negative for hematuria.  Musculoskeletal: Negative for myalgias.  Neurological: Negative for dizziness, tingling, loss of consciousness, weakness and headaches.  Endo/Heme/Allergies: Does not bruise/bleed easily.  Psychiatric/Behavioral: Positive for substance abuse (alcohol on the weekends).    Physical Exam    Blood pressure 134/66, pulse 94, temperature 98.3 F (36.8 C), temperature source Oral, resp. rate (!) 30, SpO2 100 %.  General: 52 y.o. obese African-American male resting comfortably in no acute distress. Pleasant and cooperative. HEENT:  Normal  Neck: Supple. No bruits or JVD appreciated. Lungs: No increased work of breathing. Clear to auscultation bilaterally. No wheezes, rhonchi, or rales. Heart: RRR. Distinct S1 and S2. No murmurs, gallops, or rubs. Mild chest wall soreness with palpation. Abdomen: Soft, obese, and non-tender to palpation. Bowel sounds present.  Extremities: No lower extremity edema. Posterior tibial pulses and radial pulses 2+ and equal bilaterally. Neuro: Alert and oriented x3. No focal deficits. Moves all extremities spontaneously. Psych: Normal affect.  Labs    Troponin [facility] of Care Test) Recent Labs    04/12/18 1232  TROPIPOC 0.00   No results for input(s): CKTOTAL, CKMB, TROPONINI in the last 72 hours. Lab Results  Component Value Date   WBC 7.1 04/12/2018   HGB 12.7 (L)  04/12/2018   HCT 40.2 04/12/2018   MCV 82.9 04/12/2018   PLT 257 04/12/2018    Recent Labs  Lab 04/12/18 1227  NA 137  K 4.3  CL 102  CO2 24  BUN 15  CREATININE 0.70  CALCIUM 9.7  GLUCOSE 154*   No results found for: CHOL, HDL, LDLCALC, TRIG No results found for: [facility]   Radiology Studies    Dg Chest 2 View  Result Date: 04/12/2018 CLINICAL DATA:  Chest pain for 4 days. EXAM: CHEST - 2 VIEW COMPARISON:  None. FINDINGS: There changes from a prior median sternotomy. Cardiac silhouette is normal in size and configuration. No mediastinal or hilar masses. There is no evidence of adenopathy. Mildly prominent bronchovascular markings in the lungs. Small granuloma noted in the left upper lobe. Lungs otherwise clear. No pleural effusion or pneumothorax. Skeletal structures are intact. IMPRESSION: No active cardiopulmonary disease. Electronically Signed   By: Amie Portland M.D.   On: 04/12/2018 13:43    EKG     EKG: EKG was personally reviewed and demonstrates: Sinus tachycardia, rate 103 bpm, with no acute ST changes. Telemetry: Telemetry was personally reviewed and demonstrates: Normal sinus rhythm to sinus  tachycardia, rate 90s to low 100s bpm.  Cardiac Imaging    Echocardiogram 08/21/2015: Conclusions: - Normal left ventricular systolic function with mild concentric left ventricular hypertrophy present. - Estimated overall ejection fraction is greater than 60%. - No significant valvular abnormalities are present. - Diastolic relaxation abnormality is present.  Assessment & Plan    1. Chest Pain with History of CAD - Patient presents with burning substernal exertion chest pain that resolves with rest and Nitroglycerin.  - EKG shows sinus tachycardia (rate 103 bpm) with no acute ST changes.  - I-stat troponin negative.  - Echo from 2017 showed EF >60%.  - Patient denies any current angina or shortness of breath.  - Continue home medications:  - Continue Aspirin 81mg  daily.  - Continue Amlodipine 10mg  daily.  - Continue Lopressor 50mg  twice daily.  - Continue Losartan 50g daily.  - Continue Lipitor 40mg  daily.  - Given patient's presentation, his history of CAD s/p CABG, and his cardiovascular risk factors (hypertension, hyperlipidemia, type 2 diabetes mellitus), patient may benefit from ischemic workup. Discussed with Dr. Tenny Craw who recommends proceeding with catheterization. However, patient insists on going home. Will schedule outpatient stress test. Patient to keep previously scheduled appointment with Dr. Allyson Sabal on 04/25/2018.   2. CAD s/p CABG in 2013 - Patient s/p CABG with LIMA to LAD in 06/2012 in [place], [place]. - Echo from 2017 showed normal LV systolic function with EF of >60% and mild concentric LVH. - Continue home medications.   3. Hypertension - Most recent BP 136/96.  - Continue home medications:  - Continue Amlodipine 10mg  daily.  - Continue Lopressor 50mg  twice daily.  - Continue Losartan 50mg  daily.  - Continue Spironolactone 25mg  daily.  4. Hyperlipidemia - Continue Lipitor 40mg  daily.   5. Type 2 Diabetes Mellitus - Continue Metformin 500mg  twice daily.     Signed, Corrin Parker, PA-C 04/12/2018, 5:09 PM  Pt seen and examined   I agree with findings as noted by C Irene Limbo above  Pt with known CAD  Presents with history concerning for progressive angina On exam:    Pt comfortable   No CP Neck:   JVP normal   L carotid bruit Lungs are CTA    Cardiac RRR   No S3   No murmurs  No rub Abd is benign Extremites are without edema  EKG is without acute changes   I am concerned by history and have recomm pt have this evaluated   With progressive nature I would recomm L heart cath to redefine anatomy.   Pt does not want to stay    Would rather set up for outpt stress test   Informed him that this is not 100% accurate     Pt set on leaving  Will set for outpt myovue     If pain recurs pt agrees to come back to ED  Keep on same medical regimen.  Dietrich Pates

## 2018-04-12 NOTE — ED Notes (Signed)
Lab to add-on lipase and HFP 

## 2018-04-12 NOTE — ED Triage Notes (Signed)
Pt presents for evaluation of 5 day central chest burning that is worsened with walking. Pt reports hx of CABG in 2013. States 3/10 pain.

## 2018-04-12 NOTE — ED Notes (Signed)
Patient verbalizes understanding of discharge instructions. Opportunity for questioning and answers were provided. Armband removed by staff, pt discharged from ED.  

## 2018-04-12 NOTE — Discharge Instructions (Signed)
Return immediately to the emergency department if you have any pain that occurs at rest.  SEEK IMMEDIATE MEDICAL ATTENTION IF: You have severe chest pain, especially if the pain is crushing or pressure-like and spreads to the arms, back, neck, or jaw, or if you have sweating, nausea (feeling sick to your stomach), or shortness of breath. THIS IS AN EMERGENCY. Don't wait to see if the pain will go away. Get medical help at once. Call 911 or [phone] (operator). DO NOT drive yourself to the hospital.  Your chest pain gets worse and does not go away with rest.  You have an attack of chest pain lasting longer than usual, despite rest and treatment with the medications your caregiver has prescribed.  You wake from sleep with chest pain or shortness of breath.  You feel dizzy or faint.  You have chest pain not typical of your usual pain for which you originally saw your caregiver.

## 2018-04-12 NOTE — ED Provider Notes (Addendum)
[facility] EMERGENCY DEPARTMENT Provider Note   CSN: [id number] Arrival date & time: 04/12/18  1219     History   Chief Complaint Chief Complaint  Patient presents with  . Chest Pain    HPI James Rice is a 52 y.o. male with a history of CAD, prior CABG (2013), hypertension, hyperlipidemia, diabetes followed by Dr. Nanetta Batty of cardiology who presents emergency department today for chest pain.  Patient reports that 2 weeks ago he had a large episode of drinking.  He reports the next day he had several episodes of emesis.  He notes the next day his esophagus felt irritated.  He has been taking omeprazole for this.  He reports however over the last 5 days he has been developing a burning sensation in his substernal chest as well as the bases of his right and left chest that does not radiate to his neck, jaw, or either shoulders and is brought on only with exertion such as walking.  He notes that the symptoms have occurred daily and typically lasts for about 5 minutes before being resolved.  He is tried over-the-counter anti-acid medication for his symptoms without any relief.  His last episode of pain was yesterday evening.  He notes he does have chronic chest wall pain but he states this feels different.  He is unsure if this feels similar to his prior heart attack.  He denies any associated shortness of breath, nausea, emesis, diaphoresis.  No current chest pain.  He denies any aspirin or nitroglycerin use today.  Denies fever, cough, hemoptysis, recent travel, recent immobilization, history of cancer, lower extremity swelling, history of DVT/PE.  He was seen by the [facility] and was sent here for further evaluation.  HPI  Past Medical History:  Diagnosis Date  . Coronary artery disease   . Diabetes (HCC)   . Hyperlipidemia   . Hypertension   . Obesity   . Status post coronary artery bypass grafting    06/22/12 in [facility]    Patient Active Problem List   Diagnosis Date Noted  . CAD (coronary artery disease) of artery bypass graft 05/28/2014  . Essential hypertension 05/28/2014  . Hyperlipidemia 05/28/2014  . Diabetes (HCC) 05/28/2014  . Morbid obesity (HCC) 05/28/2014    Past Surgical History:  Procedure Laterality Date  . CORONARY ARTERY BYPASS GRAFT          Home Medications    Prior to Admission medications   Medication Sig Start Date End Date Taking? Authorizing Provider  amLODipine (NORVASC) 5 MG tablet Take 1 tablet (5 mg total) by mouth daily. 06/29/16   Runell Gess, MD  aspirin 81 MG tablet Take 81 mg by mouth daily.    [provider]  atorvastatin (LIPITOR) 40 MG tablet Take 20 mg by mouth at bedtime.     [provider]  HYDROcodone-acetaminophen (NORCO) 5-325 MG per tablet Take 1-2 tablets by mouth every 6 (six) hours as needed. 06/16/14   Geoffery Lyons, MD  losartan-hydrochlorothiazide (HYZAAR) 50-12.5 MG per tablet Take 1 tablet by mouth daily. 06/10/14   [provider]  metFORMIN (GLUCOPHAGE) 500 MG tablet Take 500 mg by mouth 2 (two) times daily.  04/15/14   [provider]  metoprolol (LOPRESSOR) 50 MG tablet Take 50 mg by mouth 2 (two) times daily.  04/15/14   [provider]  Multiple Vitamin (MULTIVITAMIN) tablet Take 1 tablet by mouth daily.    [provider]  omeprazole (PRILOSEC OTC)  20 MG tablet Take 20 mg by mouth daily.    [provider]  potassium chloride (K-DUR) 10 MEQ tablet Take 10 mEq by mouth every other day. 06/10/14   [provider]    Family History No family history on file.  Social History Social History   Tobacco Use  . Smoking status: Never Smoker  . Smokeless tobacco: Never Used  Substance Use Topics  . Alcohol use: Yes    Alcohol/week: 2.0 - 3.0 standard drinks    Types: 2 - 3 Shots of liquor per week  . Drug use: No     Allergies   Fish allergy   Review of Systems Review of Systems  All  other systems reviewed and are negative.    Physical Exam Updated Vital Signs BP 139/74   Pulse 89   Temp 98.3 F (36.8 C) (Oral)   Resp (!) 22   SpO2 98%   Physical Exam  Constitutional: He appears well-developed and well-nourished.  HENT:  Head: Normocephalic and atraumatic.  Right Ear: External ear normal.  Left Ear: External ear normal.  Nose: Nose normal.  Mouth/Throat: Uvula is midline, oropharynx is clear and moist and mucous membranes are normal. No tonsillar exudate.  Eyes: Pupils are equal, round, and reactive to light. Right eye exhibits no discharge. Left eye exhibits no discharge. No scleral icterus.  Neck: Trachea normal. Neck supple. No spinous process tenderness present. No neck rigidity. Normal range of motion present.  Cardiovascular: Normal rate, regular rhythm and intact distal pulses.  No murmur heard. Pulses:      Radial pulses are 2+ on the right side, and 2+ on the left side.       Dorsalis pedis pulses are 2+ on the right side, and 2+ on the left side.       Posterior tibial pulses are 2+ on the right side, and 2+ on the left side.  No lower extremity swelling or edema. Calves symmetric in size bilaterally.  Pulmonary/Chest: Effort normal and breath sounds normal. He exhibits no tenderness.  No chest wall tenderness  Abdominal: Soft. Bowel sounds are normal. He exhibits no distension. There is no tenderness. There is no rigidity, no rebound, no guarding, no CVA tenderness and negative Murphy's sign.  No RUQ TTP  Musculoskeletal: He exhibits no edema.  Lymphadenopathy:    He has no cervical adenopathy.  Neurological: He is alert.  Skin: Skin is warm and dry. No rash noted. He is not diaphoretic.  Psychiatric: He has a normal mood and affect.  Nursing note and vitals reviewed.    ED Treatments / Results  Labs (all labs ordered are listed, but only abnormal results are displayed) Labs Reviewed  BASIC METABOLIC PANEL - Abnormal; Notable for the  following components:      Result Value   Glucose, Bld 154 (*)    All other components within normal limits  CBC - Abnormal; Notable for the following components:   Hemoglobin 12.7 (*)    All other components within normal limits  I-STAT TROPONIN, ED    EKG None  Radiology Dg Chest 2 View  Result Date: 04/12/2018 CLINICAL DATA:  Chest pain for 4 days. EXAM: CHEST - 2 VIEW COMPARISON:  None. FINDINGS: There changes from a prior median sternotomy. Cardiac silhouette is normal in size and configuration. No mediastinal or hilar masses. There is no evidence of adenopathy. Mildly prominent bronchovascular markings in the lungs. Small granuloma noted in the left upper lobe. Lungs otherwise  clear. No pleural effusion or pneumothorax. Skeletal structures are intact. IMPRESSION: No active cardiopulmonary disease. Electronically Signed   By: Amie Portland M.D.   On: 04/12/2018 13:43    Procedures Procedures (including critical care time)  Medications Ordered in ED Medications  gi cocktail (Maalox,Lidocaine,Donnatal) (has no administration in time range)     Initial Impression / Assessment and Plan / ED Course  I have reviewed the triage vital signs and the nursing notes.  Pertinent labs & imaging results that were available during my care of the patient were reviewed by me and considered in my medical decision making (see chart for details).      52 y.o. male with a history of CAD, prior CABG (2013), hypertension, hyperlipidemia, diabetes followed by Dr. Nanetta Batty of cardiology who presents emergency department today for chest pain.  Patient reports that 2 weeks ago he had a large episode of drinking & the next day had several episodes of vomiting.  He thought he caused himself esophageal irritation has been trying to treat himself with over-the-counter acid reflux medication without any relief.  He notes over the last 5 days he has developed a burning sensation of the substernal, as  well as base of the left and right burning chest that occurs only with exertion, lasts for approximately 5 minutes and resolved with rest.  Last episode was yesterday.  He denies any chest pain currently.  He reports chronic chest wall pain that is seen in prior cardiology notes that he states feels different than this.  He has no chest wall tenderness on today's exam.  His vital signs are stable.  Labs ordered in triage.  EKG with sinus tachycardia without any significant changes from prior.  His tachycardia has resolved.  Troponin within normal limits.  CBC and BMP unremarkable.  Chest x-ray without any acute disease.  Labs and imaging reviewed by myself.  Patient symptoms concerning for angina given exertional in nature.  Will consult cardiology to see the patient.  Will also give GI cocktail to see if this helps given history of vomiting before start of pain.  2:55 PM Spoke with Trish. Cardiology to see the patient.   3:34 PM  Patient reports some improvement with GI cocktail but states he cannot completley tell as he has not walked around. With cardiology to see the patient, case signed out to Huntley Dec, PA-C. Disposition pending consult by cardiology's recommendations.   Final Clinical Impressions(s) / ED Diagnoses   Final diagnoses:  Central chest pain    ED Discharge Orders    None       Slade, [place], PA-C 04/12/18 1536    Angelo, [place], PA-C 04/12/18 1549    Arby Barrette, MD 04/14/18 [phone]

## 2018-04-12 NOTE — ED Provider Notes (Addendum)
3:55 PM BP 128/66   Pulse 90   Temp 98.3 F (36.8 C) (Oral)   Resp (!) 25   SpO2 98%  Patient taken in sign out from [employer].  here with anginal sxs. Cardiology will consult.  Dispo based on their input.   Patient seen in the emergency department by Dr. Tenny Craw.  Symptoms are concerning for angina however patient has refused admission at this time.  He does understand that given his history of CABG and outpatient stress test is suboptimal and that he runs the risk of potentially worsening symptoms of ACS or even death.  He seems to have the capacity to make this decision and cardiology although they would prefer him to come in for a catheterization have agreed to let him follow-up in the outpatient very closely.  I have obtained the patient's discharge summary and catheterization report from 2013 which I scanned into the media files.  The patient will be discharged at this time.  He is to follow closely with cardiology.   Arthor Captain, PA-C 04/13/18 0049    Arthor Captain, PA-C 04/13/18 0058    Melene Plan, DO 04/16/18 [id number]

## 2018-04-13 ENCOUNTER — Telehealth: Payer: Self-pay | Admitting: Cardiovascular Disease

## 2018-04-13 NOTE — Telephone Encounter (Signed)
rqst routed to ordering provider Chelsea Aus, PA

## 2018-04-13 NOTE — Telephone Encounter (Signed)
Called patient to schedule stress test per PA.  Patient states is 100% disabled veteran and wants to know if this can done at [facility].

## 2018-04-16 NOTE — Telephone Encounter (Signed)
Left message for pt to call.

## 2018-04-17 ENCOUNTER — Telehealth: Payer: Self-pay

## 2018-04-17 NOTE — Telephone Encounter (Signed)
[facility] sent message that she was attempting to make stress test apt and patient c/o ongoing CP.I spoke with patient, he refused to go to the ED for evaluation,and said" how bout I come see you tomorrow" Apt made with Azalee Course at 10 am on 04/18/18. I stressed to patient if sx's worsen to [facility] go direct to ED. He said "ok"

## 2018-04-17 NOTE — Telephone Encounter (Signed)
Returning call.

## 2018-04-17 NOTE — Telephone Encounter (Signed)
Left message for pt to call, I need to know who at the [place] I need to contact about getting stress test done there.

## 2018-04-17 NOTE — Telephone Encounter (Signed)
New message   Call the patient to set up a nuclear stress test, the patient started complaining about not feeling well.   The patient stated whenever he walks outside - a burning sensation in the chest and back & blurred vision.    Ask the patient what's his pain level  0-10 = 6   the patient went to the emergency room last Thursday.    Pt c/o of Chest Pain: STAT if CP now or developed within 24 hours  1. Are you having CP right now? No    2. Are you experiencing any other symptoms (ex. SOB, nausea, vomiting, sweating)? No   3. How long have you been experiencing CP? About a week    4. Is your CP continuous or coming and going? Coming / going  - vomited today   5. Have you taken Nitroglycerin? Yes  ?

## 2018-04-17 NOTE — Telephone Encounter (Signed)
Patient declines to go to the ED for evaluation, requests to be seen tomorrow.Apt made tomorrow at 10 am with Azalee Course PA-C

## 2018-04-18 ENCOUNTER — Encounter: Payer: Self-pay | Admitting: Physician Assistant

## 2018-04-18 ENCOUNTER — Ambulatory Visit (INDEPENDENT_AMBULATORY_CARE_PROVIDER_SITE_OTHER): Payer: Medicare Other | Admitting: Physician Assistant

## 2018-04-18 VITALS — BP 122/64 | HR 131 | Ht 69.0 in | Wt 253.8 lb

## 2018-04-18 DIAGNOSIS — E785 Hyperlipidemia, unspecified: Secondary | ICD-10-CM | POA: Diagnosis not present

## 2018-04-18 DIAGNOSIS — I10 Essential (primary) hypertension: Secondary | ICD-10-CM | POA: Diagnosis not present

## 2018-04-18 DIAGNOSIS — I25709 Atherosclerosis of coronary artery bypass graft(s), unspecified, with unspecified angina pectoris: Secondary | ICD-10-CM | POA: Diagnosis not present

## 2018-04-18 DIAGNOSIS — G4733 Obstructive sleep apnea (adult) (pediatric): Secondary | ICD-10-CM

## 2018-04-18 DIAGNOSIS — Z9989 Dependence on other enabling machines and devices: Secondary | ICD-10-CM

## 2018-04-18 DIAGNOSIS — I480 Paroxysmal atrial fibrillation: Secondary | ICD-10-CM

## 2018-04-18 DIAGNOSIS — E119 Type 2 diabetes mellitus without complications: Secondary | ICD-10-CM

## 2018-04-18 DIAGNOSIS — F1010 Alcohol abuse, uncomplicated: Secondary | ICD-10-CM

## 2018-04-18 MED ORDER — APIXABAN 5 MG PO TABS: 5.0000 mg | ORAL_TABLET | Freq: Two times a day (BID) | ORAL | 3 refills | Status: DC

## 2018-04-18 MED ORDER — METOPROLOL TARTRATE 100 MG PO TABS: 100.0000 mg | ORAL_TABLET | Freq: Two times a day (BID) | ORAL | 3 refills | Status: DC

## 2018-04-18 NOTE — Progress Notes (Signed)
Cardiology Office Note    Date:  04/18/2018   ID:  James Rice, DOB 1965/12/07, MRN [id number]  PCP:  Knox Royalty, MD  Cardiologist:  Dr. Allyson Sabal   Chief Complaint  Patient presents with  . Chest Pain    1 week. Recent ED visit, seen for Dr. Allyson Sabal.    History of Present Illness:  James Rice is a 52 y.o. male with PMH of HTN, HLD, DM II, OSA, postop afib  and CAD s/p CABG.  He used to work at the [employer] in the past.  He had a history of CABG x1 with LIMA-LAD in December 2013 in [place].  Cardiac catheterization prior to the bypass surgery showed subtotal occlusion of the LAD at its ostium, left main was clear as well as left circumflex, RCA had 80% stenosis but was small and not graftable vessel.  LV function was preserved.  He has been asymptomatic since.  He also had CorMatrix pericardial reconstruction.  Postop course was complicated by acute early ileus which resolved and postop atrial fibrillation which converted to sinus rhythm.  He was placed on a short course of Xarelto.  He was last seen by Dr. Gery Pray in October 2018, he did complain of occasional atypical musculoskeletal chest wall pain at the time.  More recently, patient presented to the hospital on 04/12/2018 for evaluation of chest pain.  EKG shows sinus tachycardia with no acute changes.  Troponin was negative.  Chest x-ray was negative for acute process.  He was seen by Dr. Tenny Craw who recommended cardiac catheterization however patient insisted on going home.  Therefore outpatient stress test was scheduled.  Patient presents today for cardiology office visit.  According to the patient, this all started 3 weeks ago after he and his girlfriend went out drinking and ended up having nausea and vomiting.  Since then, he has been having some intermittent burning sensation in the chest, however no palpitation.  Since he left the emergency room, he had back pain that started yesterday as well.  He could  not tell me clearly whether or not the recent symptom is the same as his symptom prior to the bypass surgery in 2013.  EKG today shows new atrial fibrillation with RVR with heart rate of 131 BPM.  I discussed the case with doctor of the day Dr. Rennis Golden, we we will start the patient on Eliquis 5 mg twice daily.  For the time being I kept him on 81 mg aspirin, this can be removed if his stress test come back normal.  He is wondering if he can have the stress test at [facility], this can be done if he can have a earlier stress test then October 24.  He is scheduled for stress test here on October 24, however this can be canceled if he ended up having earlier stress test at [facility].  I would not recommend to wait any longer.  Possible cause for recurrence of atrial fibrillation may involve ischemia, alcohol abuse, hyperthyroidism or uncontrolled obstructive sleep apnea.  I will check a TSH today.  In order to better control his heart rate, I will discontinue his losartan and increase metoprolol to 100 mg twice daily.  He will see Dr. Gery Pray next week for up titration of rate control medication.  We will give the patient a 30-day coupon card for his Eliquis.  He will return to see me in 3 weeks to see if he is still in  A. fib, if so, I plan to set the patient up for outpatient DC cardioversion.  We can potentially consider echocardiogram after the cardioversion.  Past Medical History:  Diagnosis Date  . Coronary artery disease   . Diabetes (HCC)   . Hyperlipidemia   . Hypertension   . Obesity   . Status post coronary artery bypass grafting    06/22/12 in [place]    Past Surgical History:  Procedure Laterality Date  . CORONARY ARTERY BYPASS GRAFT      Current Medications: Outpatient Medications Prior to Visit  Medication Sig Dispense Refill  . Alum Hydroxide-Mag Carbonate (GAVISCON EXTRA STRENGTH PO) Take 10 mLs by mouth daily as needed (acid feflux).    Marland Kitchen amLODipine (NORVASC) 10 MG tablet Take  5 mg by mouth daily.     Marland Kitchen aspirin 81 MG tablet Take 81 mg by mouth daily.    Marland Kitchen atorvastatin (LIPITOR) 80 MG tablet Take 40 mg by mouth at bedtime.     . metFORMIN (GLUCOPHAGE) 500 MG tablet Take 500 mg by mouth 2 (two) times daily.     . Multiple Vitamin (MULTIVITAMIN) tablet Take 1 tablet by mouth daily.    . potassium chloride (K-DUR) 10 MEQ tablet Take 10 mEq by mouth every other day.  3  . spironolactone (ALDACTONE) 25 MG tablet Take 25 mg by mouth daily.    Marland Kitchen losartan (COZAAR) 100 MG tablet Take 50 mg by mouth daily.    . metoprolol (LOPRESSOR) 50 MG tablet Take 50 mg by mouth 2 (two) times daily.      No facility-administered medications prior to visit.      Allergies:   Fish allergy   Social History   Socioeconomic History  . Marital status: Single    Spouse name: Not on file  . Number of children: Not on file  . Years of education: Not on file  . Highest education level: Not on file  Occupational History  . Not on file  Social Needs  . Financial resource strain: Not on file  . Food insecurity:    Worry: Not on file    Inability: Not on file  . Transportation needs:    Medical: Not on file    Non-medical: Not on file  Tobacco Use  . Smoking status: Never Smoker  . Smokeless tobacco: Never Used  Substance and Sexual Activity  . Alcohol use: Yes    Alcohol/week: 2.0 - 3.0 standard drinks    Types: 2 - 3 Shots of liquor per week  . Drug use: No  . Sexual activity: Not on file  Lifestyle  . Physical activity:    Days per week: Not on file    Minutes per session: Not on file  . Stress: Not on file  Relationships  . Social connections:    Talks on phone: Not on file    Gets together: Not on file    Attends religious service: Not on file    Active member of club or organization: Not on file    Attends meetings of clubs or organizations: Not on file    Relationship status: Not on file  Other Topics Concern  . Not on file  Social History Narrative  . Not on  file     Family History:  The patient's family history is not on file. He was adopted.   ROS:   Please see the history of present illness.    ROS All other systems reviewed and are negative.  PHYSICAL EXAM:   VS:  BP 122/64   Pulse (!) 131   Ht 5\' 9"  (1.753 m)   Wt 253 lb 12.8 oz (115.1 kg)   BMI 37.48 kg/m    GEN: Well nourished, well developed, in no acute distress  HEENT: normal  Neck: no JVD, carotid bruits, or masses Cardiac: Tachycardic, irregular; no murmurs, rubs, or gallops,no edema  Respiratory:  clear to auscultation bilaterally, normal work of breathing GI: soft, nontender, nondistended, + BS MS: no deformity or atrophy  Skin: warm and dry, no rash Neuro:  Alert and Oriented x 3, Strength and sensation are intact Psych: euthymic mood, full affect  Wt Readings from Last 3 Encounters:  04/18/18 253 lb 12.8 oz (115.1 kg)  04/21/17 276 lb (125.2 kg)  03/22/16 271 lb 6.4 oz (123.1 kg)      Studies/Labs Reviewed:   EKG:  EKG is ordered today.  The ekg ordered today demonstrates atrial fibrillation with RVR  Recent Labs: 04/12/2018: ALT 68; BUN 15; Creatinine, Ser 0.70; Hemoglobin 12.7; Platelets 257; Potassium 4.3; Sodium 137   Lipid Panel No results found for: CHOL, TRIG, HDL, CHOLHDL, VLDL, LDLCALC, LDLDIRECT  Additional studies/ records that were reviewed today include:   CABG x 1 [facility] 06/2012 Procedure: Procedure(s): CORONARY ARTERY BYPASS GRAFTING X1 LEFT INTERNAL MAMMARY ARTERY DISSECTION, CLOSURE OF PERICARDIUM WITH CORMATRIX, PATCH, INTERNAL RIGID FIXATION OF STERNUM, WOUND VAC     ASSESSMENT:    1. Paroxysmal atrial fibrillation with RVR (HCC)   2. Coronary artery disease involving coronary bypass graft of native heart with angina pectoris (HCC)   3. Essential hypertension   4. Hyperlipidemia LDL goal <70   5. Controlled type 2 diabetes mellitus without complication, without long-term current use of insulin  (HCC)   6. OSA on CPAP   7. ETOH abuse      PLAN:  In order of problems listed above:  1. Paroxysmal atrial fibrillation with RVR: CHA2DS2-Vasc score 3 (CAD, HTN, DM II), will start on Eliquis 5 mg twice daily.  Stop losartan and increase metoprolol to 100 mg twice daily for better rate control.  Possible contributing risk factors include underlying ischemia, EtOH abuse, hyperthyroidism and uncontrolled obstructive sleep apnea.  Will check TSH  2. CAD s/p CABG: History of LIMA to LAD in 2013, had 80% residual in the RCA which was small nondominant vessel.  Given recent chest pain, he will need ischemic work-up.  He has stress test scheduled on October 24, he will check with [facility] to see if he can have a earlier stress test over there due to cost reason.  For now I have kept him on aspirin given the possibility of underlying ischemia.  3. Hypertension: Blood pressure stable on current therapy, DC losartan, increase metoprolol to 100 mg twice daily  4. Hyperlipidemia: On Lipitor 40 mg daily.   5. Obstructive sleep apnea on CPAP: He says he recently had new mask fitted  6. EtOH abuse: Advised alcohol cessation.    Medication Adjustments/Labs and Tests Ordered: Current medicines are reviewed at length with the patient today.  Concerns regarding medicines are outlined above.  Medication changes, Labs and Tests ordered today are listed in the Patient Instructions below. Patient Instructions  Medication Instructions:  Your physician has recommended you make the following change in your medication:  1) START Eliquis 5mg  twice daily. An Rx has been sent to your pharmacy. Please activate and take the card that was given to you  today to the pharmacy 2) STOP Losartan 3) INCREASE Metoprolol to 100mg  twice daily an Rx has been sent to your pharmacy.  If you need a refill on your cardiac medications before your next appointment, please call your pharmacy.   Lab work: TSH today If you have  labs (blood work) drawn today and your tests are completely normal, you will receive your results only by: Marland Kitchen MyChart Message (if you have MyChart) OR . A paper copy in the mail If you have any lab test that is abnormal or we need to change your treatment, we will call you to review the results.  Testing/Procedures: Please keep your appointment for your Lexiscan stress test. Please let us know if you will be able to have it done at the [facility] soon  Follow-Up: At [facility], you and your health needs are our priority.  As part of our continuing mission to provide you with exceptional heart care, we have created designated Provider Care Teams.  These Care Teams include your primary Cardiologist (physician) and Advanced Practice Providers (APPs -  Physician Assistants and Nurse Practitioners) who all work together to provide you with the care you need, when you need it. Please keep your appointment with Dr.Berry next week  Your physician recommends that you schedule a follow-up appointment on Nov 6 or after with Azalee Course PA  Any Other Special Instructions Will Be Listed Below (If Applicable).       Ramond Dial, [place]  04/18/2018 11:29 AM    [facility] [address] [place], [place], [place]  [address] Phone: [phone]; Fax: [phone]

## 2018-04-18 NOTE — Patient Instructions (Signed)
Medication Instructions:  Your physician has recommended you make the following change in your medication:  1) START Eliquis 5mg  twice daily. An Rx has been sent to your pharmacy. Please activate and take the card that was given to you today to the pharmacy 2) STOP Losartan 3) INCREASE Metoprolol to 100mg  twice daily an Rx has been sent to your pharmacy.  If you need a refill on your cardiac medications before your next appointment, please call your pharmacy.   Lab work: TSH today If you have labs (blood work) drawn today and your tests are completely normal, you will receive your results only by: Marland Kitchen MyChart Message (if you have MyChart) OR . A paper copy in the mail If you have any lab test that is abnormal or we need to change your treatment, we will call you to review the results.  Testing/Procedures: Please keep your appointment for your Lexiscan stress test. Please let us know if you will be able to have it done at the [facility] soon  Follow-Up: At [facility] you and your health needs are our priority.  As part of our continuing mission to provide you with exceptional heart care, we have created designated Provider Care Teams.  These Care Teams include your primary Cardiologist (physician) and Advanced Practice Providers (APPs -  Physician Assistants and Nurse Practitioners) who all work together to provide you with the care you need, when you need it. Please keep your appointment with Dr.Berry next week  Your physician recommends that you schedule a follow-up appointment on Nov 6 or after with Azalee Course PA  Any Other Special Instructions Will Be Listed Below (If Applicable).

## 2018-04-18 NOTE — Addendum Note (Signed)
Addended by: Jarvis Newcomer on: 04/18/2018 11:34 AM   Modules accepted: Orders

## 2018-04-19 LAB — TSH: TSH: <0.006 u[IU]/mL — ABNORMAL LOW (ref 0.450–4.500)

## 2018-04-19 NOTE — Progress Notes (Signed)
Thyroid level severely abnormal, patient is likely hyperthyroidism which likely contributed to his recent atrial fibrillation, will need visit with primary care provider for further management

## 2018-04-20 NOTE — Addendum Note (Signed)
Addended by: Adline Peals on: 04/20/2018 09:16 AM   Modules accepted: Orders

## 2018-04-24 ENCOUNTER — Telehealth (HOSPITAL_COMMUNITY): Payer: Self-pay

## 2018-04-24 NOTE — Telephone Encounter (Signed)
Encounter complete. 

## 2018-04-25 ENCOUNTER — Encounter: Payer: Self-pay | Admitting: Cardiovascular Disease

## 2018-04-25 ENCOUNTER — Ambulatory Visit (INDEPENDENT_AMBULATORY_CARE_PROVIDER_SITE_OTHER): Payer: Medicare Other | Admitting: Cardiovascular Disease

## 2018-04-25 DIAGNOSIS — I10 Essential (primary) hypertension: Secondary | ICD-10-CM

## 2018-04-25 DIAGNOSIS — I480 Paroxysmal atrial fibrillation: Secondary | ICD-10-CM

## 2018-04-25 DIAGNOSIS — I25700 Atherosclerosis of coronary artery bypass graft(s), unspecified, with unstable angina pectoris: Secondary | ICD-10-CM | POA: Diagnosis not present

## 2018-04-25 DIAGNOSIS — E7800 Pure hypercholesterolemia, unspecified: Secondary | ICD-10-CM

## 2018-04-25 NOTE — Assessment & Plan Note (Signed)
History of recent PAF started on Eliquis by his PCP.  EKG performed on 04/23/2018 showed A. fib with RVR.  His heart rate today is 83.  His TSH recently measured was less than 0.006 suggesting severe hyperthyroidism which is currently being worked up by an endocrinologist.

## 2018-04-25 NOTE — Assessment & Plan Note (Signed)
History of essential hypertension blood pressure measured at 124/62.  He is on amlodipine and metoprolol as well as spironolactone.  Continue current meds at current dosing.

## 2018-04-25 NOTE — Assessment & Plan Note (Signed)
History of hyperlipidemia on statin therapy followed by his PCP 
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
## 2018-04-25 NOTE — Assessment & Plan Note (Signed)
She has CAD status post CABG times 112/20/13 in [facility].  He was seen in the emergency room by Dr. Tenny Craw 04/12/2018 who suggested that he stay for cardiac catheterization because of his chest pain symptoms given his risk factors although he insisted on leaving and is currently scheduled for a Myoview stress test tomorrow.  He has had no recurrent chest pain since his ER visit.

## 2018-04-25 NOTE — Patient Instructions (Addendum)
Medication Instructions:  Your physician recommends that you continue on your current medications as directed. Please refer to the Current Medication list given to you today.  If you need a refill on your cardiac medications before your next appointment, please call your pharmacy.   Lab work: none If you have labs (blood work) drawn today and your tests are completely normal, you will receive your results only by: Marland Kitchen MyChart Message (if you have MyChart) OR . A paper copy in the mail If you have any lab test that is abnormal or we need to change your treatment, we will call you to review the results.  Testing/Procedures: Your physician has requested that you have en exercise stress myoview. For further information please visit [email or web address] Please follow instruction sheet, as given.   Follow-Up: At [facility], you and your health needs are our priority.  As part of our continuing mission to provide you with exceptional heart care, we have created designated Provider Care Teams.  These Care Teams include your primary Cardiologist (physician) and Advanced Practice Providers (APPs -  Physician Assistants and Nurse Practitioners) who all work together to provide you with the care you need, when you need it. We request that you follow-up in: 3 months with an Azalee Course, [place] and in 6 months with Dr Allyson Sabal. Please call our office 2 months in advance to schedule this appointment.  You may see Nanetta Batty, MD or one of the following Advanced Practice Providers on your designated Care Team:   Corine Shelter, PA-C Judy Pimple, [place] . Marjie Skiff, PA-C  Any Other Special Instructions Will Be Listed Below (If Applicable).

## 2018-04-26 ENCOUNTER — Ambulatory Visit (HOSPITAL_COMMUNITY)
Admission: RE | Admit: 2018-04-26 | Discharge: 2018-04-26 | Disposition: A | Discharge status: Home / Self Care | Payer: Medicare Other | Source: Ambulatory Visit | Attending: Cardiovascular Disease | Admitting: Cardiovascular Disease

## 2018-04-26 DIAGNOSIS — R079 Chest pain, unspecified: Secondary | ICD-10-CM | POA: Insufficient documentation

## 2018-04-26 NOTE — Progress Notes (Signed)
Presented today for stress test in atrial fibrillation with rapid ventricular response, heart rate in the 160s at rest. Denies dyspnea, feels a little fatigued.  Denies presyncope/angina.  Does not want to be hospitalized.  I tried to schedule for TEE-guided cardioversion but there is no availability on the schedule either today or tomorrow. He did not take his metoprolol today since he was told to stop it for the test. We will reschedule the nuclear stress test in a week's time, in the meantime try to get his heart rate under better control.  Schedule for A. fib clinic appointment early next week.  Thurmon Fair, MD, [facility] [phone] office [phone] pager

## 2018-04-27 ENCOUNTER — Encounter (HOSPITAL_COMMUNITY): Payer: Self-pay | Admitting: Nurse Practitioner

## 2018-04-27 ENCOUNTER — Inpatient Hospital Stay (HOSPITAL_COMMUNITY): Admission: RE | Admit: 2018-04-27 | Payer: Medicare Other | Source: Ambulatory Visit

## 2018-04-27 ENCOUNTER — Ambulatory Visit (HOSPITAL_COMMUNITY)
Admission: RE | Admit: 2018-04-27 | Discharge: 2018-04-27 | Disposition: A | Discharge status: Home / Self Care | Payer: Medicare Other | Source: Ambulatory Visit | Attending: Nurse Practitioner | Admitting: Nurse Practitioner

## 2018-04-27 VITALS — BP 132/74 | HR 86 | Ht 69.0 in | Wt 241.0 lb

## 2018-04-27 DIAGNOSIS — Z79899 Other long term (current) drug therapy: Secondary | ICD-10-CM | POA: Diagnosis not present

## 2018-04-27 DIAGNOSIS — E119 Type 2 diabetes mellitus without complications: Secondary | ICD-10-CM | POA: Insufficient documentation

## 2018-04-27 DIAGNOSIS — I4891 Unspecified atrial fibrillation: Secondary | ICD-10-CM | POA: Insufficient documentation

## 2018-04-27 DIAGNOSIS — Z7984 Long term (current) use of oral hypoglycemic drugs: Secondary | ICD-10-CM | POA: Diagnosis not present

## 2018-04-27 DIAGNOSIS — Z951 Presence of aortocoronary bypass graft: Secondary | ICD-10-CM | POA: Diagnosis not present

## 2018-04-27 DIAGNOSIS — I10 Essential (primary) hypertension: Secondary | ICD-10-CM | POA: Insufficient documentation

## 2018-04-27 DIAGNOSIS — I480 Paroxysmal atrial fibrillation: Secondary | ICD-10-CM

## 2018-04-27 DIAGNOSIS — E0590 Thyrotoxicosis, unspecified without thyrotoxic crisis or storm: Secondary | ICD-10-CM | POA: Insufficient documentation

## 2018-04-27 DIAGNOSIS — Z7901 Long term (current) use of anticoagulants: Secondary | ICD-10-CM | POA: Insufficient documentation

## 2018-04-27 DIAGNOSIS — E785 Hyperlipidemia, unspecified: Secondary | ICD-10-CM | POA: Insufficient documentation

## 2018-04-27 DIAGNOSIS — Z91013 Allergy to seafood: Secondary | ICD-10-CM | POA: Diagnosis not present

## 2018-04-27 DIAGNOSIS — Z7982 Long term (current) use of aspirin: Secondary | ICD-10-CM | POA: Diagnosis not present

## 2018-04-27 DIAGNOSIS — E669 Obesity, unspecified: Secondary | ICD-10-CM | POA: Diagnosis not present

## 2018-04-27 DIAGNOSIS — I2510 Atherosclerotic heart disease of native coronary artery without angina pectoris: Secondary | ICD-10-CM | POA: Insufficient documentation

## 2018-04-27 DIAGNOSIS — Z6835 Body mass index (BMI) 35.0-35.9, adult: Secondary | ICD-10-CM | POA: Insufficient documentation

## 2018-04-27 NOTE — Addendum Note (Signed)
Encounter addended by: Newman Nip, NP on: 04/27/2018 12:56 PM  Actions taken: LOS modified

## 2018-04-27 NOTE — Progress Notes (Signed)
Primary Care Physician: Knox RoyaltyJones, Enrico, MD Cardiologist: Dr. Allyson SabalBerry  Referring Dr. Berneda Roseroituro   James Rice is a 52 y.o. male with a h/o CAD, s/p 1 vessel bypass, HTN, DM, hyperlipidemia, newly diagnosed hyperthyroidism and afib, recently started on tapazole, and untreated sleep apnea. He was pendig a stress test,ordered by Dr. Allyson SabalBerry, had not taken metoprolol that am and was found to be in rapid afib in the 160's. Test was cancelled and it has been rescheduled for 11/1. He went back on his metoprolol and is now  back in SR, in the afib clinic for f/u.   Discussed with pt  how important it is to use CPAP. He is pending getting new supplies and plans to restart. He does not drink alcohol that often, but he does will have many drinks in one setting. He does not smoke, minimal  caffeine. He has f/u with his endocrinologist in [place] in the next few weeks. He had been walking regularly and had lost 30 lbs before the thyroid/afib issues started.  Today, he denies symptoms of palpitations, chest pain, shortness of breath, orthopnea, PND, lower extremity edema, dizziness, presyncope, syncope, or neurologic sequela. The patient is tolerating medications without difficulties and is otherwise without complaint today.   Past Medical History:  Diagnosis Date  . Coronary artery disease   . Diabetes (HCC)   . Hyperlipidemia   . Hypertension   . Obesity   . Status post coronary artery bypass grafting    06/22/12 in [place]   Past Surgical History:  Procedure Laterality Date  . CORONARY ARTERY BYPASS GRAFT      Current Outpatient Medications  Medication Sig Dispense Refill  . Alum Hydroxide-Mag Carbonate (GAVISCON EXTRA STRENGTH PO) Take 10 mLs by mouth daily as needed (acid feflux).    Marland Kitchen. amLODipine (NORVASC) 10 MG tablet Take 5 mg by mouth daily.     Marland Kitchen. apixaban (ELIQUIS) 5 MG TABS tablet Take 1 tablet (5 mg total) by mouth 2 (two) times daily. 180 tablet 3  . aspirin 81 MG tablet Take 81 mg by mouth  daily.    Marland Kitchen. atorvastatin (LIPITOR) 80 MG tablet Take 40 mg by mouth at bedtime.     . metFORMIN (GLUCOPHAGE) 500 MG tablet Take 500 mg by mouth 2 (two) times daily.     . metoprolol tartrate (LOPRESSOR) 100 MG tablet Take 1 tablet (100 mg total) by mouth 2 (two) times daily. 180 tablet 3  . Multiple Vitamin (MULTIVITAMIN) tablet Take 1 tablet by mouth daily.    . potassium chloride (K-DUR) 10 MEQ tablet Take 10 mEq by mouth every other day.  3  . spironolactone (ALDACTONE) 25 MG tablet Take 25 mg by mouth daily.     No current facility-administered medications for this encounter.     Allergies  Allergen Reactions  . Fish Allergy Nausea And Vomiting    Social History   Socioeconomic History  . Marital status: Single    Spouse name: Not on file  . Number of children: Not on file  . Years of education: Not on file  . Highest education level: Not on file  Occupational History  . Not on file  Social Needs  . Financial resource strain: Not on file  . Food insecurity:    Worry: Not on file    Inability: Not on file  . Transportation needs:    Medical: Not on file    Non-medical: Not on file  Tobacco Use  . Smoking status:  Never Smoker  . Smokeless tobacco: Never Used  Substance and Sexual Activity  . Alcohol use: Yes    Alcohol/week: 2.0 - 3.0 standard drinks    Types: 2 - 3 Shots of liquor per week  . Drug use: No  . Sexual activity: Not on file  Lifestyle  . Physical activity:    Days per week: Not on file    Minutes per session: Not on file  . Stress: Not on file  Relationships  . Social connections:    Talks on phone: Not on file    Gets together: Not on file    Attends religious service: Not on file    Active member of club or organization: Not on file    Attends meetings of clubs or organizations: Not on file    Relationship status: Not on file  . Intimate partner violence:    Fear of current or ex partner: Not on file    Emotionally abused: Not on file     Physically abused: Not on file    Forced sexual activity: Not on file  Other Topics Concern  . Not on file  Social History Narrative  . Not on file    Family History  Adopted: Yes    ROS- All systems are reviewed and negative except as per the HPI above  Physical Exam: Vitals:   04/27/18 0902  BP: 132/74  Pulse: 86  Weight: 109.3 kg  Height: 5\' 9"  (1.753 m)   Wt Readings from Last 3 Encounters:  04/27/18 109.3 kg  04/25/18 112.5 kg  04/18/18 115.1 kg    Labs: Lab Results  Component Value Date   NA 137 04/12/2018   K 4.3 04/12/2018   CL 102 04/12/2018   CO2 24 04/12/2018   GLUCOSE 154 (H) 04/12/2018   BUN 15 04/12/2018   CREATININE 0.70 04/12/2018   CALCIUM 9.7 04/12/2018   No results found for: INR No results found for: CHOL, HDL, LDLCALC, TRIG   GEN- The patient is well appearing, alert and oriented x 3 today.   Head- normocephalic, atraumatic Eyes-  Sclera clear, conjunctiva pink Ears- hearing intact Oropharynx- clear Neck- supple, no JVP Lymph- no cervical lymphadenopathy Lungs- Clear to ausculation bilaterally, normal work of breathing Heart- Regular rate and rhythm, no murmurs, rubs or gallops, PMI not laterally displaced GI- soft, NT, ND, + BS Extremities- no clubbing, cyanosis, or edema MS- no significant deformity or atrophy Skin- no rash or lesion Psych- euthymic mood, full affect Neuro- strength and sensation are intact  EKG-NSR at 86 bpm, biatrial  Enlargement, pr int 150 ms, qrs int 76 ms, qtc 483 ms Labs form 1022 care everywhere were reviewed   Assessment and Plan: 1. New onset afib in the setting of hyperthyroidism Now back in SR General education re afib As the thyroid normalizes, I feel the afib episodes will also lessen Continue eliquis 5 mg bid for a chadsvasc score of at least 3 I would advise for pt to have stress test on BB's  He was advised to limit alcohol He was encouraged to use cpap He was congratulated on his recent  walking and weight loss routine that is currently on hold until his thyroid  is better managed  F/u for stress test 11/1 F/u in afib clinic in one month, sooner if needed  Lupita Leash C. Matthew Folks [facility] [address] [place], [place] [address] [phone]

## 2018-05-02 ENCOUNTER — Telehealth (HOSPITAL_COMMUNITY): Payer: Self-pay

## 2018-05-02 NOTE — Telephone Encounter (Signed)
Encounter complete. 

## 2018-05-04 ENCOUNTER — Ambulatory Visit (HOSPITAL_COMMUNITY)
Admission: RE | Admit: 2018-05-04 | Discharge: 2018-05-04 | Disposition: A | Discharge status: Home / Self Care | Payer: Medicare Other | Source: Ambulatory Visit | Attending: Cardiovascular Disease | Admitting: Cardiovascular Disease

## 2018-05-04 DIAGNOSIS — R079 Chest pain, unspecified: Secondary | ICD-10-CM | POA: Diagnosis present

## 2018-05-04 LAB — MYOCARDIAL PERFUSION IMAGING
CHL CUP NUCLEAR SSS: 2
LVDIAVOL: 155 mL (ref 62–150)
LVSYSVOL: 57 mL
Peak HR: 89 {beats}/min
Rest HR: 73 {beats}/min
SDS: 2
SRS: 0
TID: 1.17

## 2018-05-04 MED ORDER — REGADENOSON 0.4 MG/5ML IV SOLN
0.4000 mg | Freq: Once | INTRAVENOUS | Status: AC
  Administered 2018-05-04: 0.4 mg via INTRAVENOUS

## 2018-05-04 MED ORDER — TECHNETIUM TC 99M TETROFOSMIN IV KIT
30.5000 | PACK | Freq: Once | INTRAVENOUS | Status: AC | PRN
  Administered 2018-05-04: 30.5 via INTRAVENOUS
  Filled 2018-05-04: qty 31

## 2018-05-04 MED ORDER — TECHNETIUM TC 99M TETROFOSMIN IV KIT
10.4000 | PACK | Freq: Once | INTRAVENOUS | Status: AC | PRN
  Administered 2018-05-04: 10.4 via INTRAVENOUS
  Filled 2018-05-04: qty 11

## 2018-05-22 ENCOUNTER — Other Ambulatory Visit (HOSPITAL_COMMUNITY): Payer: Self-pay | Admitting: *Deleted

## 2018-05-22 ENCOUNTER — Ambulatory Visit (HOSPITAL_COMMUNITY)
Admission: RE | Admit: 2018-05-22 | Discharge: 2018-05-22 | Disposition: A | Discharge status: Home / Self Care | Payer: Medicare Other | Source: Ambulatory Visit | Attending: Nurse Practitioner | Admitting: Nurse Practitioner

## 2018-05-22 ENCOUNTER — Encounter (HOSPITAL_COMMUNITY): Payer: Self-pay | Admitting: Nurse Practitioner

## 2018-05-22 VITALS — BP 120/66 | HR 72 | Ht 69.0 in | Wt 253.0 lb

## 2018-05-22 DIAGNOSIS — E119 Type 2 diabetes mellitus without complications: Secondary | ICD-10-CM | POA: Insufficient documentation

## 2018-05-22 DIAGNOSIS — I480 Paroxysmal atrial fibrillation: Secondary | ICD-10-CM | POA: Diagnosis not present

## 2018-05-22 DIAGNOSIS — Z7982 Long term (current) use of aspirin: Secondary | ICD-10-CM | POA: Diagnosis not present

## 2018-05-22 DIAGNOSIS — I10 Essential (primary) hypertension: Secondary | ICD-10-CM | POA: Diagnosis not present

## 2018-05-22 DIAGNOSIS — Z79899 Other long term (current) drug therapy: Secondary | ICD-10-CM | POA: Insufficient documentation

## 2018-05-22 DIAGNOSIS — I2510 Atherosclerotic heart disease of native coronary artery without angina pectoris: Secondary | ICD-10-CM | POA: Insufficient documentation

## 2018-05-22 DIAGNOSIS — Z7984 Long term (current) use of oral hypoglycemic drugs: Secondary | ICD-10-CM | POA: Insufficient documentation

## 2018-05-22 DIAGNOSIS — I4891 Unspecified atrial fibrillation: Secondary | ICD-10-CM | POA: Diagnosis present

## 2018-05-22 DIAGNOSIS — E785 Hyperlipidemia, unspecified: Secondary | ICD-10-CM | POA: Diagnosis not present

## 2018-05-22 DIAGNOSIS — E0590 Thyrotoxicosis, unspecified without thyrotoxic crisis or storm: Secondary | ICD-10-CM | POA: Diagnosis not present

## 2018-05-22 DIAGNOSIS — E669 Obesity, unspecified: Secondary | ICD-10-CM | POA: Diagnosis not present

## 2018-05-22 DIAGNOSIS — Z7901 Long term (current) use of anticoagulants: Secondary | ICD-10-CM | POA: Diagnosis not present

## 2018-05-22 DIAGNOSIS — Z6837 Body mass index (BMI) 37.0-37.9, adult: Secondary | ICD-10-CM | POA: Insufficient documentation

## 2018-05-22 DIAGNOSIS — Z951 Presence of aortocoronary bypass graft: Secondary | ICD-10-CM | POA: Diagnosis not present

## 2018-05-22 MED ORDER — APIXABAN 5 MG PO TABS: 5.0000 mg | ORAL_TABLET | Freq: Two times a day (BID) | ORAL | 3 refills | Status: DC

## 2018-05-22 MED ORDER — APIXABAN 5 MG PO TABS: 5.0000 mg | ORAL_TABLET | Freq: Two times a day (BID) | ORAL | 3 refills | Status: AC

## 2018-05-22 NOTE — Telephone Encounter (Signed)
[facility] cld [facility] and a new Rx as well as any supporting labs to fill his Eliquis.  These were faxed to [phone].

## 2018-05-22 NOTE — Progress Notes (Signed)
Primary Care Physician: Knox RoyaltyJones, Enrico, MD Cardiologist: Dr. Allyson SabalBerry  Referring Dr. Berneda Roseroituro   James Rice is a 52 y.o. male with a h/o CAD, s/p 1 vessel bypass, HTN, DM, hyperlipidemia, newly diagnosed hyperthyroidism and afib, recently started on tapazole, and untreated sleep apnea. He was pendig a stress test,ordered by Dr. Allyson SabalBerry, had not taken metoprolol that am and was found to be in rapid afib in the 160's. Test was cancelled and it has been rescheduled for 11/1. He went back on his metoprolol and is now  back in [place], in the afib clinic for f/u.   Discussed with pt  how important it is to use CPAP. He is pending getting new supplies and plans to restart. He does not drink alcohol that often, but he does will have many drinks in one setting. He does not smoke, minimal  caffeine. He has f/u with his endocrinologist in [place] in the next few weeks. He had been walking regularly and had lost 30 lbs before the thyroid/afib issues started.  Today, he denies symptoms of palpitations, chest pain, shortness of breath, orthopnea, PND, lower extremity edema, dizziness, presyncope, syncope, or neurologic sequela. The patient is tolerating medications without difficulties and is otherwise without complaint today.   Past Medical History:  Diagnosis Date  . Coronary artery disease   . Diabetes (HCC)   . Hyperlipidemia   . Hypertension   . Obesity   . Status post coronary artery bypass grafting    06[place]/12 [place]   Past Surgical History:  Procedure Laterality Date  . CORONARY ARTERY BYPASS GRAFT      Current Outpatient Medications  Medication Sig Dispense Refill  . Alum Hydroxide-Mag Carbonate (GAVISCON EXTRA STRENGTH PO) Take 10 mLs by mouth daily as needed (acid feflux).    Marland Kitchen. amLODipine (NORVASC) 10 MG tablet Take 5 mg by mouth daily.     Marland Kitchen. apixaban (ELIQUIS) 5 MG TABS tablet Take 1 tablet (5 mg total) by mouth 2 (two) times daily. 180 tablet 3  . aspirin 81 MG tablet Take 81 mg by mouth  daily.    Marland Kitchen. atorvastatin (LIPITOR) 80 MG tablet Take 40 mg by mouth at bedtime.     . metFORMIN (GLUCOPHAGE) 500 MG tablet Take 500 mg by mouth 2 (two) times daily.     . metoprolol tartrate (LOPRESSOR) 100 MG tablet Take 1 tablet (100 mg total) by mouth 2 (two) times daily. 180 tablet 3  . Multiple Vitamin (MULTIVITAMIN) tablet Take 1 tablet by mouth daily.    . potassium chloride (K-DUR) 10 MEQ tablet Take 10 mEq by mouth every other day.  3  . spironolactone (ALDACTONE) 25 MG tablet Take 25 mg by mouth daily.     No current facility-administered medications for this encounter.     Allergies  Allergen Reactions  . Fish Allergy Nausea And Vomiting    Social History   Socioeconomic History  . Marital status: Single    Spouse name: Not on file  . Number of children: Not on file  . Years of education: Not on file  . Highest education level: Not on file  Occupational History  . Not on file  Social Needs  . Financial resource strain: Not on file  . Food insecurity:    Worry: Not on file    Inability: Not on file  . Transportation needs:    Medical: Not on file    Non-medical: Not on file  Tobacco Use  . Smoking status:  Never Smoker  . Smokeless tobacco: Never Used  Substance and Sexual Activity  . Alcohol use: Yes    Alcohol/week: 2.0 - 3.0 standard drinks    Types: 2 - 3 Shots of liquor per week  . Drug use: No  . Sexual activity: Not on file  Lifestyle  . Physical activity:    Days per week: Not on file    Minutes per session: Not on file  . Stress: Not on file  Relationships  . Social connections:    Talks on phone: Not on file    Gets together: Not on file    Attends religious service: Not on file    Active member of club or organization: Not on file    Attends meetings of clubs or organizations: Not on file    Relationship status: Not on file  . Intimate partner violence:    Fear of current or ex partner: Not on file    Emotionally abused: Not on file     Physically abused: Not on file    Forced sexual activity: Not on file  Other Topics Concern  . Not on file  Social History Narrative  . Not on file    Family History  Adopted: Yes    ROS- All systems are reviewed and negative except as per the HPI above  Physical Exam: Vitals:   05/22/18 1110  BP: 120/66  Pulse: 72  Weight: 114.8 kg  Height: 5\' 9"  (1.753 m)   Wt Readings from Last 3 Encounters:  05/22/18 114.8 kg  05/04/18 114.8 kg  04/27/18 109.3 kg    Labs: Lab Results  Component Value Date   NA 137 04/12/2018   K 4.3 04/12/2018   CL 102 04/12/2018   CO2 24 04/12/2018   GLUCOSE 154 (H) 04/12/2018   BUN 15 04/12/2018   CREATININE 0.70 04/12/2018   CALCIUM 9.7 04/12/2018   No results found for: INR No results found for: CHOL, HDL, LDLCALC, TRIG   GEN- The patient is well appearing, alert and oriented x 3 today.   Head- normocephalic, atraumatic Eyes-  Sclera clear, conjunctiva pink Ears- hearing intact Oropharynx- clear Neck- supple, no JVP Lymph- no cervical lymphadenopathy Lungs- Clear to ausculation bilaterally, normal work of breathing Heart- Regular rate and rhythm, no murmurs, rubs or gallops, PMI not laterally displaced GI- soft, NT, ND, + BS Extremities- no clubbing, cyanosis, or edema MS- no significant deformity or atrophy Skin- no rash or lesion Psych- euthymic mood, full affect Neuro- strength and sensation are intact  EKG-NSR at 72 bpm, , pr int 182 ms, qrs int 82 ms, qtc 464 ms Labs form 1022 care everywhere were reviewed  Stress myoview-11/1-The left ventricular ejection fraction is normal (55-65%).  Nuclear stress EF: 63%.  There was no ST segment deviation noted during stress.  The study is normal. This is a low risk study.  Assessment and Plan: 1. New onset afib in the setting of hyperthyroidism Now back in [place], pt has not been aware of any further afib General review of triggers Continue following thyroid issues with  endocrinologist  in [place] Continue eliquis 5 mg bid for a chadsvasc score of at least 3 He was advised to limit alcohol He was encouraged to use cpap He was congratulated on his recent walking and weight loss  F/u for stress test 11/1  F/u in afib clinic as needed Dr. Allyson Sabal in the Spring per recall Azalee Course PA in January per recall  Elvina Sidle.  [facility] [address] [place], [place] [address] [phone]

## 2018-07-16 ENCOUNTER — Other Ambulatory Visit: Payer: Self-pay

## 2018-07-16 ENCOUNTER — Other Ambulatory Visit: Payer: Self-pay | Admitting: Nurse Practitioner

## 2018-07-16 DIAGNOSIS — R748 Abnormal levels of other serum enzymes: Secondary | ICD-10-CM

## 2018-07-18 ENCOUNTER — Ambulatory Visit
Admission: RE | Admit: 2018-07-18 | Discharge: 2018-07-18 | Disposition: A | Discharge status: Home / Self Care | Payer: Medicare Other | Source: Ambulatory Visit

## 2018-07-18 DIAGNOSIS — R748 Abnormal levels of other serum enzymes: Secondary | ICD-10-CM

## 2018-08-07 ENCOUNTER — Emergency Department (HOSPITAL_COMMUNITY)
Admission: EM | Admit: 2018-08-07 | Discharge: 2018-08-08 | Disposition: A | Discharge status: Home / Self Care | Payer: Non-veteran care | Attending: Emergency Medicine | Admitting: Emergency Medicine

## 2018-08-07 ENCOUNTER — Other Ambulatory Visit: Payer: Self-pay

## 2018-08-07 ENCOUNTER — Encounter (HOSPITAL_COMMUNITY): Payer: Self-pay | Admitting: Emergency Medicine

## 2018-08-07 ENCOUNTER — Emergency Department (HOSPITAL_COMMUNITY): Payer: Non-veteran care

## 2018-08-07 DIAGNOSIS — Z7901 Long term (current) use of anticoagulants: Secondary | ICD-10-CM | POA: Insufficient documentation

## 2018-08-07 DIAGNOSIS — M546 Pain in thoracic spine: Secondary | ICD-10-CM | POA: Diagnosis not present

## 2018-08-07 DIAGNOSIS — Z79899 Other long term (current) drug therapy: Secondary | ICD-10-CM | POA: Insufficient documentation

## 2018-08-07 DIAGNOSIS — I2510 Atherosclerotic heart disease of native coronary artery without angina pectoris: Secondary | ICD-10-CM | POA: Diagnosis not present

## 2018-08-07 DIAGNOSIS — R0789 Other chest pain: Secondary | ICD-10-CM | POA: Insufficient documentation

## 2018-08-07 DIAGNOSIS — E119 Type 2 diabetes mellitus without complications: Secondary | ICD-10-CM | POA: Diagnosis not present

## 2018-08-07 DIAGNOSIS — Z7984 Long term (current) use of oral hypoglycemic drugs: Secondary | ICD-10-CM | POA: Diagnosis not present

## 2018-08-07 DIAGNOSIS — I10 Essential (primary) hypertension: Secondary | ICD-10-CM | POA: Insufficient documentation

## 2018-08-07 DIAGNOSIS — R079 Chest pain, unspecified: Secondary | ICD-10-CM

## 2018-08-07 DIAGNOSIS — Z951 Presence of aortocoronary bypass graft: Secondary | ICD-10-CM | POA: Insufficient documentation

## 2018-08-07 LAB — BASIC METABOLIC PANEL
ANION GAP: 10 (ref 5–15)
BUN: 11 mg/dL (ref 6–20)
CALCIUM: 9.2 mg/dL (ref 8.9–10.3)
CO2: 25 mmol/L (ref 22–32)
CREATININE: 0.97 mg/dL (ref 0.61–1.24)
Chloride: 100 mmol/L (ref 98–111)
GLUCOSE: 105 mg/dL — AB (ref 70–99)
Potassium: 4.3 mmol/L (ref 3.5–5.1)
Sodium: 135 mmol/L (ref 135–145)

## 2018-08-07 LAB — CBC
HCT: 45.6 % (ref 39.0–52.0)
Hemoglobin: 14.5 g/dL (ref 13.0–17.0)
MCH: 25.6 pg — AB (ref 26.0–34.0)
MCHC: 31.8 g/dL (ref 30.0–36.0)
MCV: 80.4 fL (ref 80.0–100.0)
PLATELETS: 282 10*3/uL (ref 150–400)
RBC: 5.67 MIL/uL (ref 4.22–5.81)
RDW: 15.8 % — AB (ref 11.5–15.5)
WBC: 11.1 10*3/uL — ABNORMAL HIGH (ref 4.0–10.5)
nRBC: 0 % (ref 0.0–0.2)

## 2018-08-07 LAB — I-STAT TROPONIN, ED: TROPONIN I, POC: 0.01 ng/mL (ref 0.00–0.08)

## 2018-08-07 MED ORDER — SODIUM CHLORIDE 0.9% FLUSH: 3.0000 mL | Freq: Once | INTRAVENOUS | Status: DC

## 2018-08-07 NOTE — ED Triage Notes (Signed)
Pt reports pressure to L upper back that radiates to L chest since Thursday.  Pt took 2 NTG today with some relief.  Denies SOB, nausea, and vomiting.

## 2018-08-08 DIAGNOSIS — R0789 Other chest pain: Secondary | ICD-10-CM | POA: Diagnosis not present

## 2018-08-08 LAB — I-STAT TROPONIN, ED: TROPONIN I, POC: 0.01 ng/mL (ref 0.00–0.08)

## 2018-08-08 MED ORDER — METHOCARBAMOL 500 MG PO TABS: 500.0000 mg | ORAL_TABLET | Freq: Two times a day (BID) | ORAL | 0 refills | Status: DC

## 2018-08-08 NOTE — Discharge Instructions (Signed)
Take the prescribed medication as directed.  Can continue your tramadol if you like as well.  Heat can help loosen up back muscle-- heating pad, warm compress, hot shower, etc. Follow-up with your primary care doctor. Return to the ED for new or worsening symptoms.

## 2018-08-08 NOTE — ED Provider Notes (Signed)
[facility] Provider Note   CSN: [id number] Arrival date & time: 08/07/18  2119     History   Chief Complaint Chief Complaint  Patient presents with  . Chest Pain  . Back Pain    HPI James Rice is a 53 y.o. male.  The history is provided by the patient and medical records.  Chest Pain  Associated symptoms: back pain   Back Pain  Associated symptoms: chest pain      53 year old male with history of hypertension, hyperlipidemia, diabetes, coronary artery disease status post single-vessel bypass, paroxysmal A. fib on Eliquis, Graves' disease, presenting to the ED with left back pain.  States this began last Thursday, 6 days ago.  States pain mostly has been in left shoulder blade and mid back but has since started radiating around to his chest.  States it feels dull and achy, worse with certain movements.  He denies any shortness of breath, palpitations, dizziness, diaphoresis, nausea, vomiting, or feelings of syncope.  He is not a smoker.  He has had appropriate follow-up with his cardiologist.  Had recent stress test on 05/04/2018 that was low risk.  He has been working hard to lose weight and follow dietary modifications.  He does report prior to onset of symptoms he slept on the couch that night and was helping his mother move some things in her house including a box of albums.  He also carried groceries and rucksack up the stairs-- unsure if he possibly strained a muscle.  He took some tramadol at home which resolved his pain but has since recurred again.  Past Medical History:  Diagnosis Date  . Coronary artery disease   . Diabetes (HCC)   . Hyperlipidemia   . Hypertension   . Obesity   . Status post coronary artery bypass grafting    06/[facility]    Patient Active Problem List   Diagnosis Date Noted  . Paroxysmal atrial fibrillation (HCC) 04/25/2018  . CAD (coronary artery disease) of artery bypass graft 05/28/2014  .  Essential hypertension 05/28/2014  . Hyperlipidemia 05/28/2014  . Diabetes (HCC) 05/28/2014  . Morbid obesity (HCC) 05/28/2014    Past Surgical History:  Procedure Laterality Date  . CORONARY ARTERY BYPASS GRAFT          Home Medications    Prior to Admission medications   Medication Sig Start Date End Date Taking? Authorizing Provider  Alum Hydroxide-Mag Carbonate (GAVISCON EXTRA STRENGTH PO) Take 10 mLs by mouth daily as needed (acid feflux).    [provider]  amLODipine (NORVASC) 10 MG tablet Take 5 mg by mouth daily.     [provider]  apixaban (ELIQUIS) 5 MG TABS tablet Take 1 tablet (5 mg total) by mouth 2 (two) times daily. 05/22/18   Newman Niparroll, Donna C, NP  aspirin 81 MG tablet Take 81 mg by mouth daily.    [provider]  atorvastatin (LIPITOR) 80 MG tablet Take 40 mg by mouth at bedtime.     [provider]  metFORMIN (GLUCOPHAGE) 500 MG tablet Take 500 mg by mouth 2 (two) times daily.  04/15/14   [provider]  metoprolol tartrate (LOPRESSOR) 100 MG tablet Take 1 tablet (100 mg total) by mouth 2 (two) times daily. 04/18/18   Azalee CourseMeng, Hao, PA  Multiple Vitamin (MULTIVITAMIN) tablet Take 1 tablet by mouth daily.    [provider]  potassium chloride (K-DUR) 10 MEQ tablet Take 10 mEq by  mouth every other day. 06/10/14   [provider]  spironolactone (ALDACTONE) 25 MG tablet Take 25 mg by mouth daily.    [provider]    Family History Family History  Adopted: Yes    Social History Social History   Tobacco Use  . Smoking status: Never Smoker  . Smokeless tobacco: Never Used  Substance Use Topics  . Alcohol use: Yes    Alcohol/week: 2.0 - 3.0 standard drinks    Types: 2 - 3 Shots of liquor per week  . Drug use: No     Allergies   Fish allergy   Review of Systems Review of Systems  Cardiovascular: Positive for chest pain.  Musculoskeletal: Positive for back pain.  All other  systems reviewed and are negative.    Physical Exam Updated Vital Signs BP 116/73 (BP Location: Right Arm)   Pulse (!) 50   Temp 98.2 F (36.8 C) (Oral)   Resp 17   SpO2 100%   Physical Exam Vitals signs and nursing note reviewed.  Constitutional:      Appearance: He is well-developed.  HENT:     Head: Normocephalic and atraumatic.  Eyes:     Conjunctiva/sclera: Conjunctivae normal.     Pupils: Pupils are equal, round, and reactive to light.  Neck:     Musculoskeletal: Normal range of motion.  Cardiovascular:     Rate and Rhythm: Normal rate and regular rhythm.     Heart sounds: Normal heart sounds.  Pulmonary:     Effort: Pulmonary effort is normal.     Breath sounds: Normal breath sounds.  Chest:     Comments: Mild tenderness of the left/midsternal chest; no deformities Abdominal:     General: Bowel sounds are normal.     Palpations: Abdomen is soft.  Musculoskeletal: Normal range of motion.     Comments: Left thoracic back with muscular tenderness noted, there is no appreciable rash or other overlying skin changes, there is no significant spasm, no midline tenderness, step-off, or deformity, pain elicited with extending left arm across his chest, normal grip strength  Skin:    General: Skin is warm and dry.  Neurological:     Mental Status: He is alert and oriented to person, place, and time.      ED Treatments / Results  Labs (all labs ordered are listed, but only abnormal results are displayed) Labs Reviewed  BASIC METABOLIC PANEL - Abnormal; Notable for the following components:      Result Value   Glucose, Bld 105 (*)    All other components within normal limits  CBC - Abnormal; Notable for the following components:   WBC 11.1 (*)    MCH 25.6 (*)    RDW 15.8 (*)    All other components within normal limits  I-STAT TROPONIN, ED  I-STAT TROPONIN, ED    EKG EKG Interpretation  Date/Time:  Tuesday August 07 2018 21:27:50 EST Ventricular Rate:   59 PR Interval:  186 QRS Duration: 84 QT Interval:  420 QTC Calculation: 415 R Axis:   79 Text Interpretation:  Sinus bradycardia Otherwise normal ECG No significant change since last tracing Confirmed by Zadie RhineWickline, Donald ([id number]) on 08/08/2018 4:10:06 AM   Radiology Dg Chest 2 View  Result Date: 08/07/2018 CLINICAL DATA:  53 year old male with chest pain and pressure radiating from the left upper back for 5 days. Some relief with nitroglycerin earlier today. EXAM: CHEST - 2 VIEW COMPARISON:  Chest radiographs 04/12/2018. FINDINGS: Prior sternotomy.  Cardiac and mediastinal contours remain normal. Visualized tracheal air column is within normal limits. Mildly lower lung volumes. No pneumothorax, pulmonary edema, pleural effusion or acute pulmonary opacity. Small calcified granulomas redemonstrated in the left upper lobe. No acute osseous abnormality identified. Negative visible bowel gas pattern. IMPRESSION: Lower lung volumes, otherwise no acute cardiopulmonary abnormality. Electronically Signed   By: Odessa Fleming M.D.   On: 08/07/2018 21:50    Procedures Procedures (including critical care time)  Medications Ordered in ED Medications  sodium chloride flush (NS) 0.9 % injection 3 mL (has no administration in time range)     Initial Impression / Assessment and Plan / ED Course  I have reviewed the triage vital signs and the nursing notes.  Pertinent labs & imaging results that were available during my care of the patient were reviewed by me and considered in my medical decision making (see chart for details).  53 year old male here with left thoracic back pain with some radiation to the chest.  This began several days ago.  Prior to symptoms patient does report sleeping on a couch, helping his mother move some things in the house including a box of albums, also carried groceries on a rucksack box of stairs.  He is not having any shortness of breath, palpitations, dizziness, or weakness.  Pain is  reproducible with palpation on exam.  EKG is sinus rhythm without acute ischemic changes.  Initial labs and troponin are reassuring.  Chest x-ray is clear.  I suspect that this is likely musculoskeletal in nature, however patient does have known cardiac history.  Will obtain delta troponin.  Delta troponin is negative.  Given negative evaluation here, feel he is stable for discharge home.  Will treat symptomatically with muscle relaxer, he has home tramadol that he can take as well if needed.  Encouraged heat therapy with warm compress, hot shower, etc.  He can follow closely with his primary care doctor.  He will return here for any new or worsening symptoms.  Final Clinical Impressions(s) / ED Diagnoses   Final diagnoses:  Acute left-sided thoracic back pain  Chest pain in adult    ED Discharge Orders         Ordered    methocarbamol (ROBAXIN) 500 MG tablet  2 times daily     08/08/18 0537           Garlon Hatchet, PA-C 08/08/18 8675    Zadie Rhine, MD 08/08/18 [phone]

## 2018-09-05 ENCOUNTER — Other Ambulatory Visit (HOSPITAL_COMMUNITY): Payer: Self-pay | Admitting: *Deleted

## 2018-09-05 MED ORDER — METOPROLOL TARTRATE 100 MG PO TABS: 100.0000 mg | ORAL_TABLET | Freq: Two times a day (BID) | ORAL | 3 refills | Status: AC

## 2018-10-24 ENCOUNTER — Telehealth: Payer: Self-pay | Admitting: *Deleted

## 2018-10-24 NOTE — Telephone Encounter (Signed)
LVM letting patient know that his appointment with Dr. Allyson Sabal on October 26, 2018 had been canceled and to call back so we can get that appointment rescheduled for some time in late August.

## 2018-10-26 ENCOUNTER — Telehealth: Payer: Non-veteran care | Admitting: Cardiovascular Disease

## 2019-03-21 ENCOUNTER — Telehealth: Payer: Self-pay | Admitting: Cardiovascular Disease

## 2019-03-21 NOTE — Telephone Encounter (Signed)
LVM for patient to call and schedule 6 month followup with Dr. Gwenlyn Found.

## 2019-08-09 ENCOUNTER — Other Ambulatory Visit: Payer: Self-pay

## 2019-08-09 ENCOUNTER — Encounter: Payer: Self-pay | Admitting: Cardiovascular Disease

## 2019-08-09 ENCOUNTER — Ambulatory Visit (INDEPENDENT_AMBULATORY_CARE_PROVIDER_SITE_OTHER): Payer: Medicare Other | Admitting: Cardiovascular Disease

## 2019-08-09 VITALS — BP 126/79 | HR 86 | Temp 97.0°F | Ht 69.0 in | Wt 258.2 lb

## 2019-08-09 DIAGNOSIS — I10 Essential (primary) hypertension: Secondary | ICD-10-CM | POA: Diagnosis not present

## 2019-08-09 DIAGNOSIS — I25810 Atherosclerosis of coronary artery bypass graft(s) without angina pectoris: Secondary | ICD-10-CM | POA: Diagnosis not present

## 2019-08-09 DIAGNOSIS — R0989 Other specified symptoms and signs involving the circulatory and respiratory systems: Secondary | ICD-10-CM

## 2019-08-09 DIAGNOSIS — I480 Paroxysmal atrial fibrillation: Secondary | ICD-10-CM | POA: Diagnosis not present

## 2019-08-09 DIAGNOSIS — G4733 Obstructive sleep apnea (adult) (pediatric): Secondary | ICD-10-CM

## 2019-08-09 NOTE — Assessment & Plan Note (Signed)
History of hyperlipidemia on statin therapy followed at the [facility] in [place]
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
## 2019-08-09 NOTE — Assessment & Plan Note (Signed)
History of essential hypertension blood pressure measured today 126/79.  He is on amlodipine and metoprolol.

## 2019-08-09 NOTE — Assessment & Plan Note (Signed)
History of PAF in the past thought to be related to hyperthyroidism which has been corrected.  He is currently in sinus rhythm on Eliquis and metoprolol.  He did have a self-limited breakthrough episode this past week and he took an extra metoprolol which resolved the rhythm abnormality.

## 2019-08-09 NOTE — Patient Instructions (Signed)
Medication Instructions:  Your physician recommends that you continue on your current medications as directed. Please refer to the Current Medication list given to you today.  If you need a refill on your cardiac medications before your next appointment, please call your pharmacy.   Lab work: NONE  Testing/Procedures: Your physician has requested that you have a carotid duplex. This test is an ultrasound of the carotid arteries in your neck. It looks at blood flow through these arteries that supply the brain with blood. Allow one hour for this exam. There are no restrictions or special instructions.  Follow-Up: At [facility], you and your health needs are our priority.  As part of our continuing mission to provide you with exceptional heart care, we have created designated Provider Care Teams.  These Care Teams include your primary Cardiologist (physician) and Advanced Practice Providers (APPs -  Physician Assistants and Nurse Practitioners) who all work together to provide you with the care you need, when you need it. You may see Nanetta Batty, MD or one of the following Advanced Practice Providers on your designated Care Team:    Corine Shelter, PA-C  [place], [place]  Edd Fabian, [place]  Your physician wants you to follow-up in: 1 year with Dr. Allyson Sabal

## 2019-08-09 NOTE — Assessment & Plan Note (Signed)
History of obstructive sleep apnea on CPAP. 

## 2019-08-09 NOTE — Assessment & Plan Note (Signed)
History of CAD status post CABG times 112/20/13 and vomiting.  He did have a negative Myoview stress test done because of chest pain 05/04/2018 and has had no recurrent symptoms.

## 2019-08-20 ENCOUNTER — Ambulatory Visit (HOSPITAL_COMMUNITY)
Admission: RE | Admit: 2019-08-20 | Payer: Medicare Other | Source: Ambulatory Visit | Attending: Cardiovascular Disease | Admitting: Cardiovascular Disease

## 2019-08-29 ENCOUNTER — Ambulatory Visit (HOSPITAL_COMMUNITY)
Admission: RE | Admit: 2019-08-29 | Payer: Medicare Other | Source: Ambulatory Visit | Attending: Cardiovascular Disease | Admitting: Cardiovascular Disease

## 2019-09-03 ENCOUNTER — Other Ambulatory Visit: Payer: Self-pay

## 2019-09-03 ENCOUNTER — Ambulatory Visit (HOSPITAL_COMMUNITY)
Admission: RE | Admit: 2019-09-03 | Discharge: 2019-09-03 | Disposition: A | Discharge status: Home / Self Care | Payer: Medicare Other | Source: Ambulatory Visit | Attending: Cardiology | Admitting: Cardiology

## 2019-09-03 ENCOUNTER — Other Ambulatory Visit (HOSPITAL_COMMUNITY): Payer: Self-pay | Admitting: Cardiovascular Disease

## 2019-09-03 DIAGNOSIS — R0989 Other specified symptoms and signs involving the circulatory and respiratory systems: Secondary | ICD-10-CM

## 2019-09-03 DIAGNOSIS — I6523 Occlusion and stenosis of bilateral carotid arteries: Secondary | ICD-10-CM

## 2019-09-04 DIAGNOSIS — I25810 Atherosclerosis of coronary artery bypass graft(s) without angina pectoris: Secondary | ICD-10-CM

## 2019-10-02 ENCOUNTER — Telehealth: Payer: Self-pay | Admitting: *Deleted

## 2019-10-02 NOTE — Telephone Encounter (Signed)
Routed to pharmacy for recommendations. 

## 2019-10-02 NOTE — Telephone Encounter (Signed)
   Primary Cardiologist: Nanetta Batty, MD  Chart reviewed as part of pre-operative protocol coverage. He was last seen by Dr. Allyson Sabal on 08/09/2019. He was stable from a CV standpoint.   Per Pharmacy:   Patient with diagnosis of atrial fibrillation on Eliquis for anticoagulation.    Procedure: Colonoscopy Date of procedure: 10/16/19  CHADS2-VASc score of  3 (HTN, DM2, CAD)  CrCl 142 ml/min Platelet count 282  Per office protocol, patient can hold Eliquis for 2 days prior to procedure  Given past medical history and time since last visit, based on ACC/AHA guidelines, JAKEEL STARLIPER would be at acceptable risk for the planned procedure without further cardiovascular testing.   I will route this recommendation to the requesting party via Epic fax function and remove from pre-op pool.  Please call with questions.  Bettey Mare. Liborio Nixon, ANP, AACC  10/02/2019, 4:00 PM

## 2019-10-02 NOTE — Telephone Encounter (Signed)
   [facility] Pre-operative Risk Assessment    Request for surgical clearance:  1. What type of surgery is being performed? Colonoscopy   2. When is this surgery scheduled? 10/16/2019   3. What type of clearance is required (medical clearance vs. Pharmacy clearance to hold med vs. Both)? Medical  4. Are there any medications that need to be held prior to surgery and how long?Eliquis 2 days   5. Practice name and name of physician performing surgery? [facility] Dr Nicki Reaper Cornella   6. What is your office phone number [phone]    7.   What is your office fax number [phone]  8.   Anesthesia type (None, local, MAC, general) ? Georgiann Hahn 10/02/2019, 3:24 PM  _________________________________________________________________   (provider comments below)

## 2019-10-02 NOTE — Telephone Encounter (Signed)
Patient with diagnosis of atrial fibrillation on Eliquis for anticoagulation.    Procedure: Colonoscopy Date of procedure: 10/16/19  CHADS2-VASc score of  3 (HTN, DM2, CAD)  CrCl 142 ml/min Platelet count 282  Per office protocol, patient can hold Eliquis for 2 days prior to procedure.

## 2020-09-08 IMAGING — CR DG CHEST 2V
2 series · 2 of 2 positions shown · non-contrast
Comparison: Chest radiographs 04/12/2018.

CLINICAL DATA: 52-year-old male with chest pain and pressure
radiating from the left upper back for 5 days. Some relief with
nitroglycerin earlier today.

EXAM:
CHEST - 2 VIEW

[chest pa]
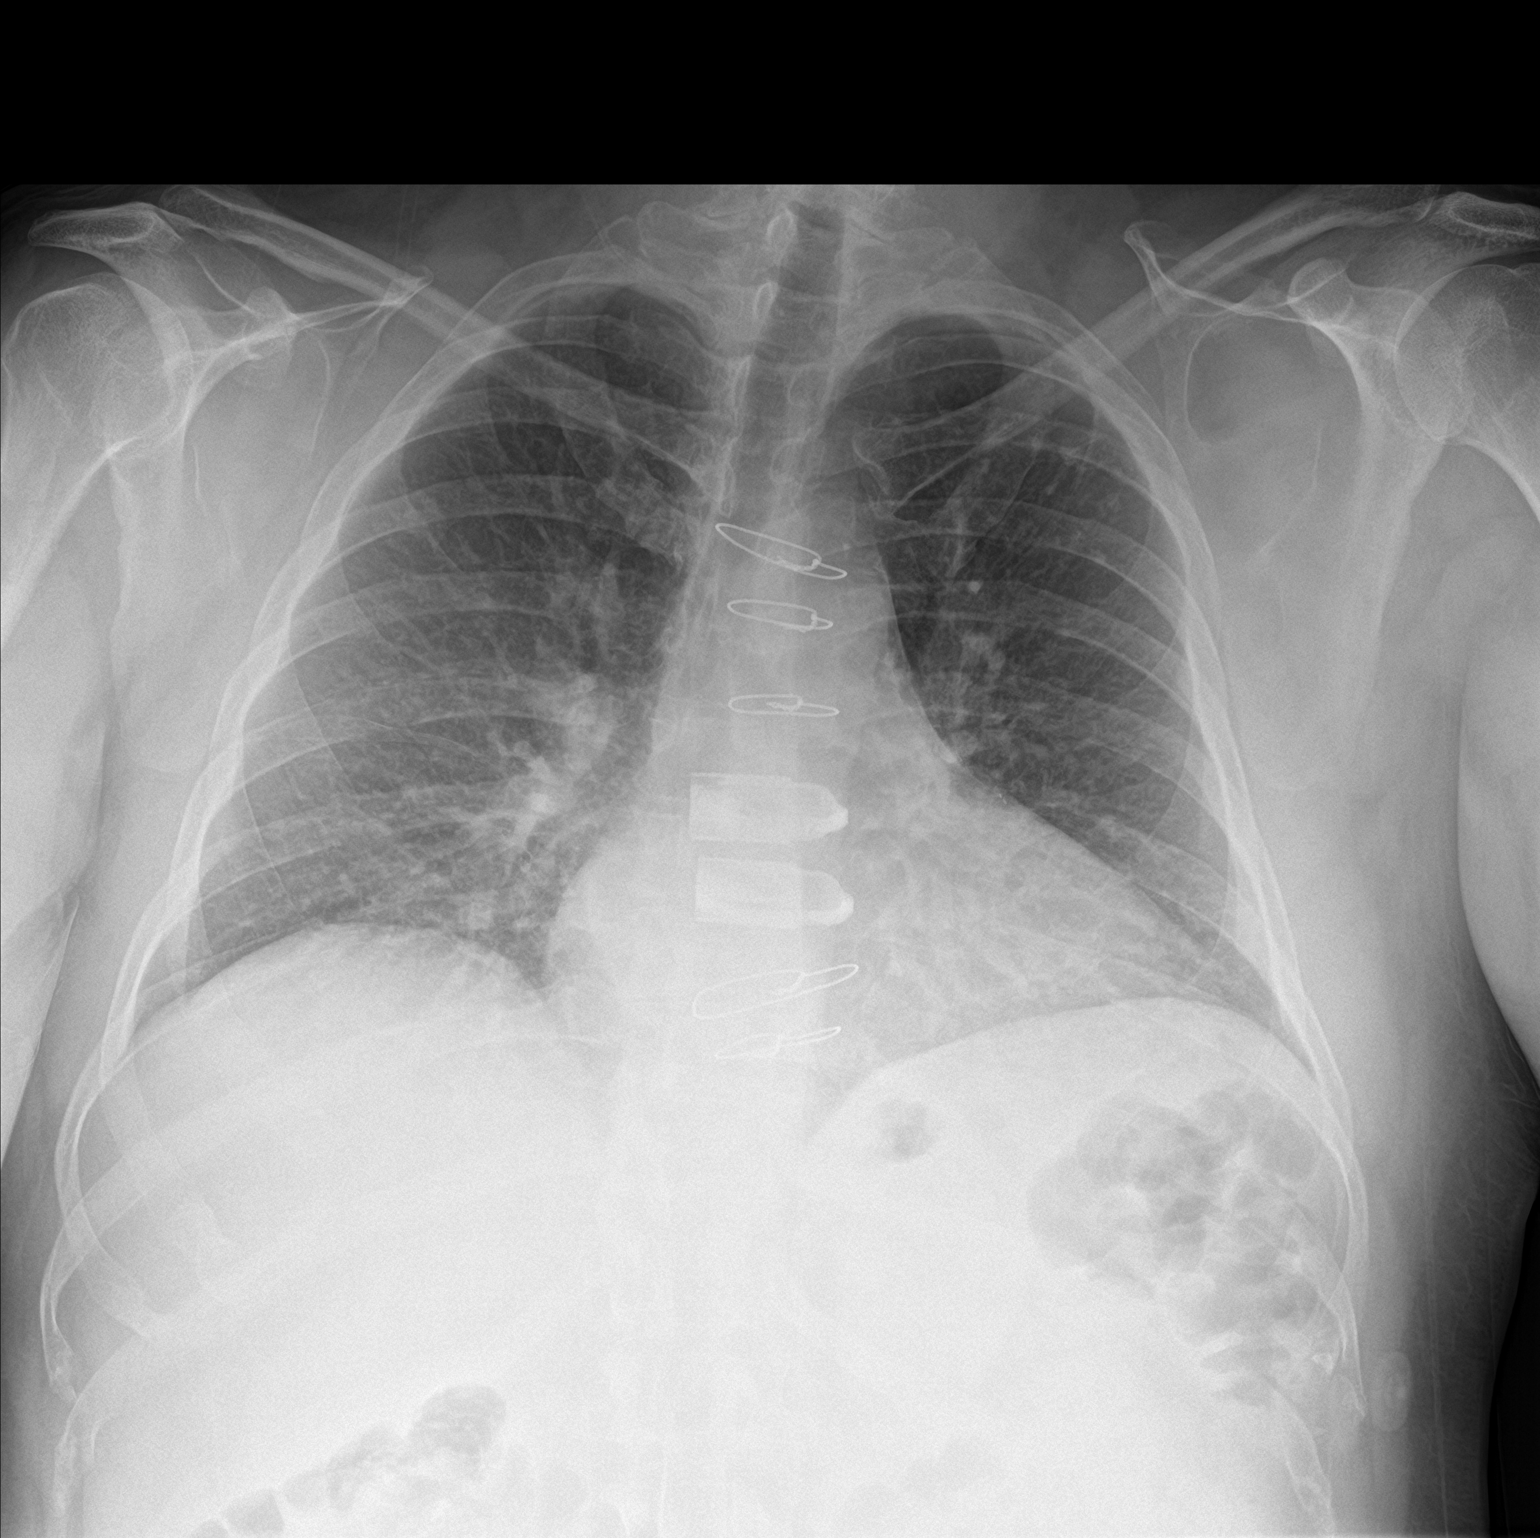

[chest lat]
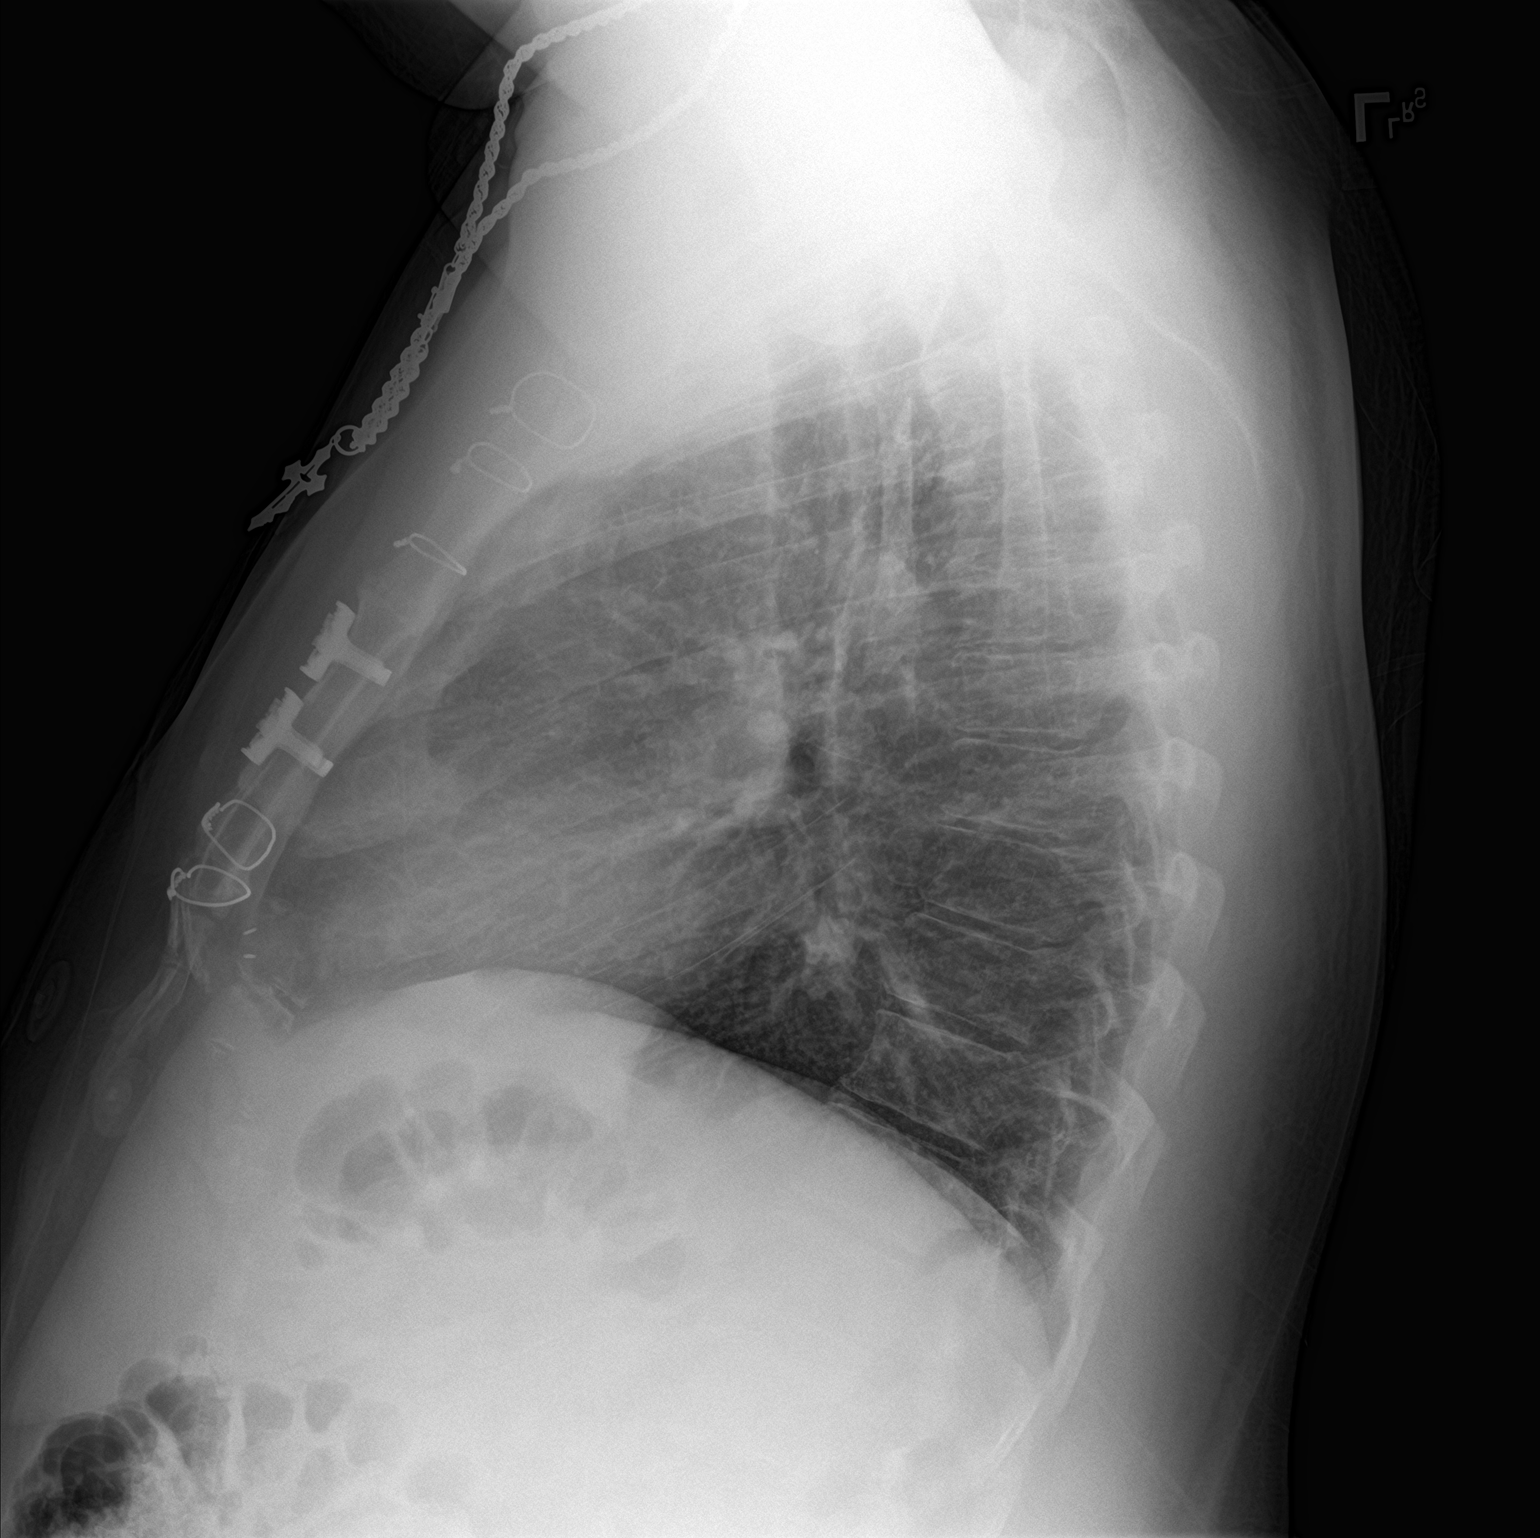

[2 of 2 positions shown; findings below may reference images not displayed]

FINDINGS: Prior sternotomy. Cardiac and mediastinal contours remain normal.
Visualized tracheal air column is within normal limits. Mildly lower
lung volumes. No pneumothorax, pulmonary edema, pleural effusion or
acute pulmonary opacity. Small calcified granulomas redemonstrated
in the left upper lobe. No acute osseous abnormality identified.
Negative visible bowel gas pattern.
IMPRESSION: Lower lung volumes, otherwise no acute cardiopulmonary abnormality.

## 2020-09-09 ENCOUNTER — Other Ambulatory Visit: Payer: Self-pay

## 2020-09-09 ENCOUNTER — Other Ambulatory Visit (HOSPITAL_COMMUNITY): Payer: Self-pay | Admitting: Cardiovascular Disease

## 2020-09-09 ENCOUNTER — Ambulatory Visit (HOSPITAL_COMMUNITY)
Admission: RE | Admit: 2020-09-09 | Discharge: 2020-09-09 | Disposition: A | Discharge status: Home / Self Care | Payer: Medicare Other | Source: Ambulatory Visit | Attending: Cardiology | Admitting: Cardiology

## 2020-09-09 DIAGNOSIS — I6523 Occlusion and stenosis of bilateral carotid arteries: Secondary | ICD-10-CM | POA: Diagnosis present

## 2020-10-02 ENCOUNTER — Encounter: Payer: Self-pay | Admitting: Cardiovascular Disease

## 2020-10-02 ENCOUNTER — Ambulatory Visit (INDEPENDENT_AMBULATORY_CARE_PROVIDER_SITE_OTHER): Payer: Medicare Other | Admitting: Cardiovascular Disease

## 2020-10-02 ENCOUNTER — Other Ambulatory Visit: Payer: Self-pay

## 2020-10-02 DIAGNOSIS — I480 Paroxysmal atrial fibrillation: Secondary | ICD-10-CM | POA: Diagnosis not present

## 2020-10-02 DIAGNOSIS — I6523 Occlusion and stenosis of bilateral carotid arteries: Secondary | ICD-10-CM | POA: Diagnosis not present

## 2020-10-02 DIAGNOSIS — I10 Essential (primary) hypertension: Secondary | ICD-10-CM

## 2020-10-02 DIAGNOSIS — I25810 Atherosclerosis of coronary artery bypass graft(s) without angina pectoris: Secondary | ICD-10-CM

## 2020-10-02 DIAGNOSIS — G4733 Obstructive sleep apnea (adult) (pediatric): Secondary | ICD-10-CM

## 2020-10-02 DIAGNOSIS — E782 Mixed hyperlipidemia: Secondary | ICD-10-CM | POA: Diagnosis not present

## 2020-10-02 DIAGNOSIS — I779 Disorder of arteries and arterioles, unspecified: Secondary | ICD-10-CM | POA: Insufficient documentation
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
## 2020-10-02 NOTE — Patient Instructions (Signed)
Medication Instructions:  Your physician recommends that you continue on your current medications as directed. Please refer to the Current Medication list given to you today.  *If you need a refill on your cardiac medications before your next appointment, please call your pharmacy*   Testing/Procedures: Your physician has requested that you have a carotid duplex. This test is an ultrasound of the carotid arteries in your neck. It looks at blood flow through these arteries that supply the brain with blood. Allow one hour for this exam. There are no restrictions or special instructions. This procedure is done at [address] [facility]nd [address]. To be done in March 2023.    Follow-Up: At [facility], you and your health needs are our priority.  As part of our continuing mission to provide you with exceptional heart care, we have created designated Provider Care Teams.  These Care Teams include your primary Cardiologist (physician) and Advanced Practice Providers (APPs -  Physician Assistants and Nurse Practitioners) who all work together to provide you with the care you need, when you need it.  We recommend signing up for the patient portal called "MyChart".  Sign up information is provided on this After Visit Summary.  MyChart is used to connect with patients for Virtual Visits (Telemedicine).  Patients are able to view lab/test results, encounter notes, upcoming appointments, etc.  Non-urgent messages can be sent to your provider as well.   To learn more about what you can do with MyChart, go to [email or web address].    Your next appointment:   12 month(s)  The format for your next appointment:   In Person  Provider:   Nanetta Batty, MD

## 2020-10-02 NOTE — Assessment & Plan Note (Signed)
History of moderate bilateral ICA stenosis by duplex ultrasound 09/09/2020.  He does have bilateral carotid bruits left louder than right.  Is neurologically asymptomatic.  We will repeat this on annual basis.

## 2020-10-02 NOTE — Assessment & Plan Note (Signed)
History of PAF in the past probably related to both alcohol intake and severe hyperthyroidism both of which have been addressed.  He is on Eliquis oral anticoagulation as well as high-dose beta-blocker.  He is maintained sinus rhythm.

## 2020-10-02 NOTE — Assessment & Plan Note (Signed)
History of CAD status post coronary artery bypass grafting x1 in [facility] 06/22/2012.  He did have a negative Myoview stress test 05/04/2018.  He denies ischemic chest pain but has had some musculoskeletal pain which she is noticed after lifting weights.

## 2020-10-02 NOTE — Assessment & Plan Note (Signed)
History of hyperlipidemia on statin therapy followed at the [facility] in [place]

## 2020-10-02 NOTE — Assessment & Plan Note (Signed)
History of obstructive sleep apnea on CPAP. 

## 2020-10-02 NOTE — Assessment & Plan Note (Signed)
History of essential hypertension blood pressure measured today at 120/66.  He is on amlodipine, metoprolol and spironolactone.

## 2021-01-12 ENCOUNTER — Telehealth: Payer: Self-pay

## 2021-01-12 NOTE — Telephone Encounter (Signed)
   [facility] Pre-operative Risk Assessment      Request for surgical clearance:  What type of surgery is being performed  Colonoscopy  When is this surgery scheduled 01/20/2021  What type of clearance is required Pharmacy   Are there any medications that need to be held prior to surgery and how long Eliquis  Practice name and name of physician performing surgery   [facility]  Dr.Cornella  What is the office phone number [phone]   7.   What is the office fax number [phone]  8.   Anesthesia type Not listed   Neoma Laming 01/12/2021, 2:12 PM  _________________________________________________________________   (provider comments below)

## 2021-01-13 NOTE — Telephone Encounter (Signed)
   Name: James Rice  DOB: 1965/10/10  MRN: [id number]   Primary Cardiologist: Nanetta Batty, MD  Chart reviewed as part of pre-operative protocol coverage.   Per our clinical pharmacist: Patient with diagnosis of afib on Eliquis for anticoagulation.     Procedure: colonoscopy Date of procedure: 01/20/21   CHA2DS2-VASc Score = 3  This indicates a 3.2% annual risk of stroke. The patient's score is based upon: CHF History: No HTN History: Yes Diabetes History: Yes Stroke History: No Vascular Disease History: Yes Age Score: 0 Gender Score: 0    CrCl >161mL/min Platelet count 249K   Per office protocol, patient can hold Eliquis for 1-2 days prior to procedure.    I will route this recommendation to the requesting party via Epic fax function and remove from pre-op pool. Please call with questions.  [name], PA 01/13/2021, 9:24 AM

## 2021-01-13 NOTE — Telephone Encounter (Signed)
Patient with diagnosis of afib on Eliquis for anticoagulation.    Procedure: colonoscopy Date of procedure: 01/20/21  CHA2DS2-VASc Score = 3  This indicates a 3.2% annual risk of stroke. The patient's score is based upon: CHF History: No HTN History: Yes Diabetes History: Yes Stroke History: No Vascular Disease History: Yes Age Score: 0 Gender Score: 0   CrCl >120mL/min Platelet count 249K  Per office protocol, patient can hold Eliquis for 1-2 days prior to procedure.

## 2021-09-09 ENCOUNTER — Other Ambulatory Visit: Payer: Self-pay

## 2021-09-09 ENCOUNTER — Ambulatory Visit (HOSPITAL_COMMUNITY)
Admission: RE | Admit: 2021-09-09 | Discharge: 2021-09-09 | Disposition: A | Discharge status: Home / Self Care | Payer: Medicare Other | Source: Ambulatory Visit | Attending: Internal Medicine | Admitting: Internal Medicine

## 2021-09-09 DIAGNOSIS — I6523 Occlusion and stenosis of bilateral carotid arteries: Secondary | ICD-10-CM | POA: Diagnosis present
